# Patient Record
Sex: Female | Born: 1975 | Race: Black or African American | Hispanic: No | Marital: Single | Smoking: Current every day smoker
Health system: Southern US, Community
[De-identification: ages and names within clinical notes are randomized; demographics above are authoritative.]

## PROBLEM LIST (undated history)

## (undated) DIAGNOSIS — E669 Obesity, unspecified: Secondary | ICD-10-CM

## (undated) HISTORY — PX: OTHER SURGICAL HISTORY: SHX169

## (undated) HISTORY — PX: TONSILLECTOMY: SUR1361

## (undated) HISTORY — PX: TUBAL LIGATION: SHX77

---

## 1997-07-10 ENCOUNTER — Inpatient Hospital Stay (HOSPITAL_COMMUNITY): Admission: AD | Admit: 1997-07-10 | Discharge: 1997-07-10 | Payer: Self-pay | Admitting: *Deleted

## 1997-07-11 ENCOUNTER — Inpatient Hospital Stay (HOSPITAL_COMMUNITY): Admission: AD | Admit: 1997-07-11 | Discharge: 1997-07-11 | Payer: Self-pay | Admitting: *Deleted

## 1997-07-12 ENCOUNTER — Inpatient Hospital Stay (HOSPITAL_COMMUNITY): Admission: AD | Admit: 1997-07-12 | Discharge: 1997-07-15 | Payer: Self-pay | Admitting: *Deleted

## 1998-02-09 ENCOUNTER — Emergency Department (HOSPITAL_COMMUNITY): Admission: EM | Admit: 1998-02-09 | Discharge: 1998-02-09 | Payer: Self-pay | Admitting: Emergency Medicine

## 1998-04-17 ENCOUNTER — Ambulatory Visit (HOSPITAL_COMMUNITY): Admission: RE | Admit: 1998-04-17 | Discharge: 1998-04-17 | Payer: Self-pay | Admitting: *Deleted

## 1998-05-19 ENCOUNTER — Inpatient Hospital Stay (HOSPITAL_COMMUNITY): Admission: AD | Admit: 1998-05-19 | Discharge: 1998-05-19 | Payer: Self-pay | Admitting: *Deleted

## 1998-05-19 ENCOUNTER — Encounter: Payer: Self-pay | Admitting: *Deleted

## 1998-08-15 ENCOUNTER — Inpatient Hospital Stay (HOSPITAL_COMMUNITY): Admission: AD | Admit: 1998-08-15 | Discharge: 1998-08-15 | Payer: Self-pay | Admitting: *Deleted

## 1998-09-14 ENCOUNTER — Ambulatory Visit (HOSPITAL_COMMUNITY): Admission: RE | Admit: 1998-09-14 | Discharge: 1998-09-14 | Payer: Self-pay | Admitting: *Deleted

## 1998-09-14 ENCOUNTER — Inpatient Hospital Stay (HOSPITAL_COMMUNITY): Admission: AD | Admit: 1998-09-14 | Discharge: 1998-09-17 | Payer: Self-pay | Admitting: *Deleted

## 1999-08-04 ENCOUNTER — Ambulatory Visit (HOSPITAL_COMMUNITY): Admission: RE | Admit: 1999-08-04 | Discharge: 1999-08-04 | Payer: Self-pay | Admitting: *Deleted

## 1999-08-04 ENCOUNTER — Encounter: Payer: Self-pay | Admitting: *Deleted

## 1999-10-11 ENCOUNTER — Inpatient Hospital Stay (HOSPITAL_COMMUNITY): Admission: AD | Admit: 1999-10-11 | Discharge: 1999-10-13 | Payer: Self-pay | Admitting: *Deleted

## 1999-11-25 ENCOUNTER — Ambulatory Visit (HOSPITAL_COMMUNITY): Admission: RE | Admit: 1999-11-25 | Discharge: 1999-11-25 | Payer: Self-pay | Admitting: *Deleted

## 2001-06-16 ENCOUNTER — Emergency Department (HOSPITAL_COMMUNITY): Admission: EM | Admit: 2001-06-16 | Discharge: 2001-06-16 | Payer: Self-pay | Admitting: Emergency Medicine

## 2002-08-15 ENCOUNTER — Emergency Department (HOSPITAL_COMMUNITY): Admission: EM | Admit: 2002-08-15 | Discharge: 2002-08-15 | Payer: Self-pay | Admitting: Emergency Medicine

## 2002-10-05 ENCOUNTER — Encounter: Payer: Self-pay | Admitting: Emergency Medicine

## 2002-10-05 ENCOUNTER — Emergency Department (HOSPITAL_COMMUNITY): Admission: EM | Admit: 2002-10-05 | Discharge: 2002-10-05 | Payer: Self-pay | Admitting: Emergency Medicine

## 2002-12-28 ENCOUNTER — Inpatient Hospital Stay (HOSPITAL_COMMUNITY): Admission: AD | Admit: 2002-12-28 | Discharge: 2002-12-28 | Payer: Self-pay | Admitting: Obstetrics & Gynecology

## 2003-09-14 ENCOUNTER — Inpatient Hospital Stay (HOSPITAL_COMMUNITY): Admission: AD | Admit: 2003-09-14 | Discharge: 2003-09-14 | Payer: Self-pay | Admitting: Obstetrics & Gynecology

## 2003-09-18 ENCOUNTER — Ambulatory Visit (HOSPITAL_COMMUNITY): Admission: RE | Admit: 2003-09-18 | Discharge: 2003-09-18 | Payer: Self-pay | Admitting: General Surgery

## 2003-09-24 ENCOUNTER — Observation Stay (HOSPITAL_COMMUNITY): Admission: RE | Admit: 2003-09-24 | Discharge: 2003-09-25 | Payer: Self-pay | Admitting: General Surgery

## 2003-09-24 ENCOUNTER — Encounter (INDEPENDENT_AMBULATORY_CARE_PROVIDER_SITE_OTHER): Payer: Self-pay | Admitting: Specialist

## 2003-11-10 ENCOUNTER — Emergency Department (HOSPITAL_COMMUNITY): Admission: EM | Admit: 2003-11-10 | Discharge: 2003-11-10 | Payer: Self-pay | Admitting: *Deleted

## 2004-02-06 ENCOUNTER — Emergency Department (HOSPITAL_COMMUNITY): Admission: EM | Admit: 2004-02-06 | Discharge: 2004-02-06 | Payer: Self-pay | Admitting: Emergency Medicine

## 2004-08-16 ENCOUNTER — Inpatient Hospital Stay (HOSPITAL_COMMUNITY): Admission: AD | Admit: 2004-08-16 | Discharge: 2004-08-16 | Payer: Self-pay | Admitting: Obstetrics

## 2004-11-11 ENCOUNTER — Emergency Department (HOSPITAL_COMMUNITY): Admission: EM | Admit: 2004-11-11 | Discharge: 2004-11-11 | Payer: Self-pay | Admitting: Family Medicine

## 2004-12-27 IMAGING — RF DG CHOLANGIOGRAM OPERATIVE
1 series · 16 of 16 positions shown · non-contrast
Comparison: none

CLINICAL DATA: cholelithiasis; cholecystitis
 OPERATIVE CHOLANGIOGRAM
 The common bile duct is normal in caliber.  There are several small questionable filling defects seen within the mid-common duct noted on the cholangiogram.  There is spillage of contrast into the duodenum.  The intrahepatic biliary radicals have a normal appearance and there are no strictures.  
 IMPRESSION
 Question several small retained common duct stones within the mid-common bile duct (versus injected air bubbles).  Free spillage of contrast into the duodenum noted.

[Series 1: run · 16 of 16 slices shown]
[im 1/16]
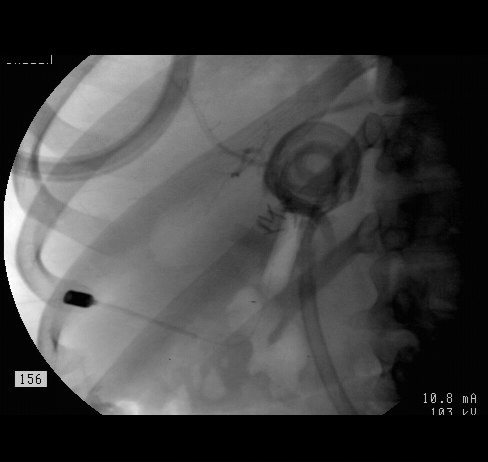
[im 2/16]
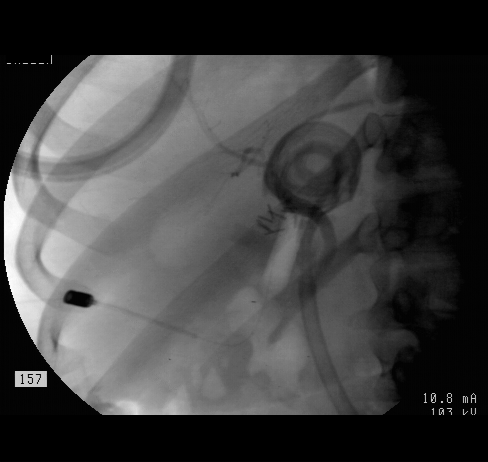
[im 3/16]
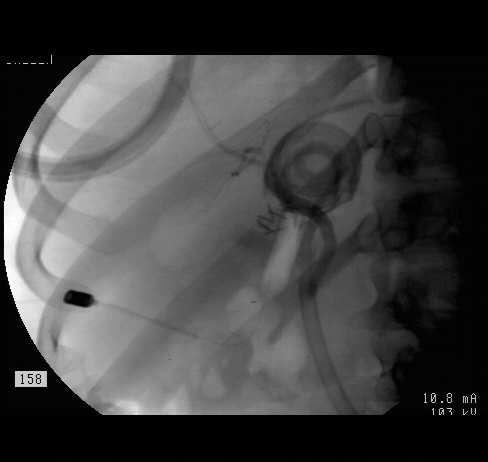
[im 4/16]
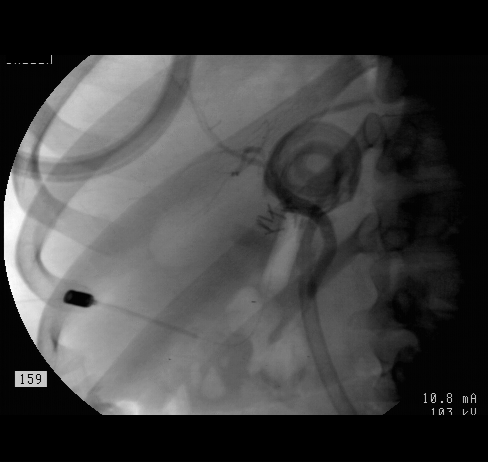
[im 5/16]
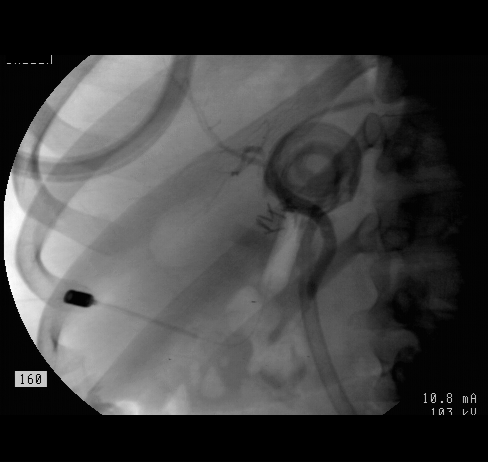
[im 6/16]
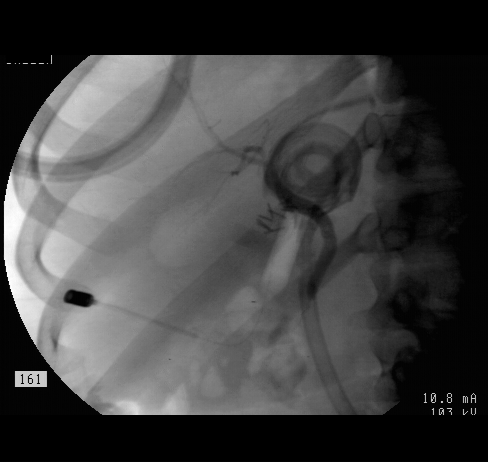
[im 7/16]
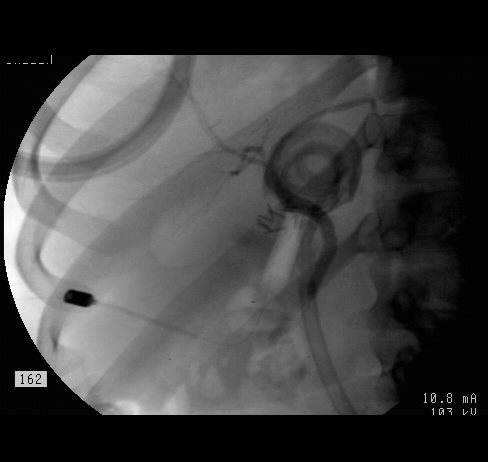
[im 8/16]
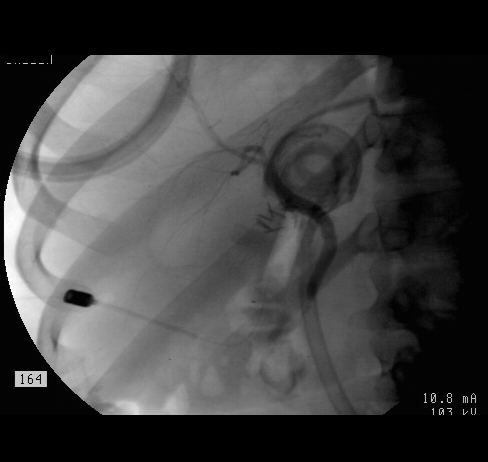
[im 9/16]
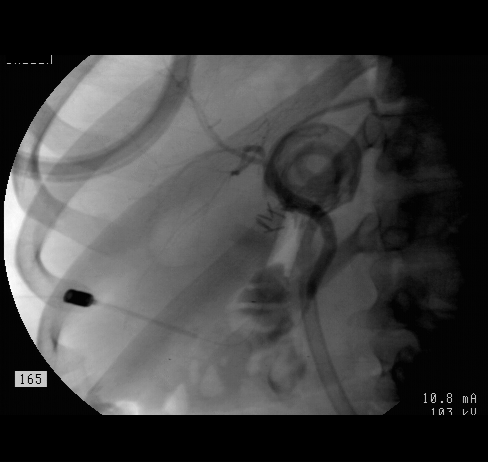
[im 10/16]
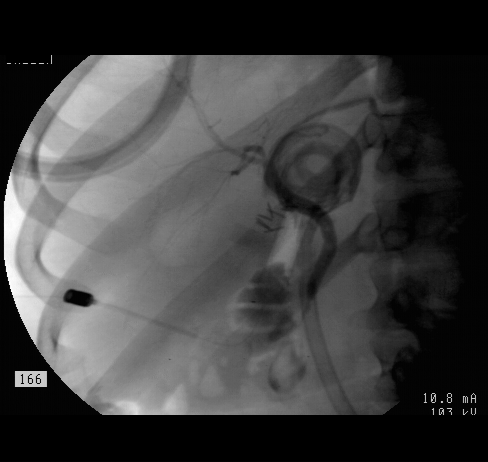
[im 11/16]
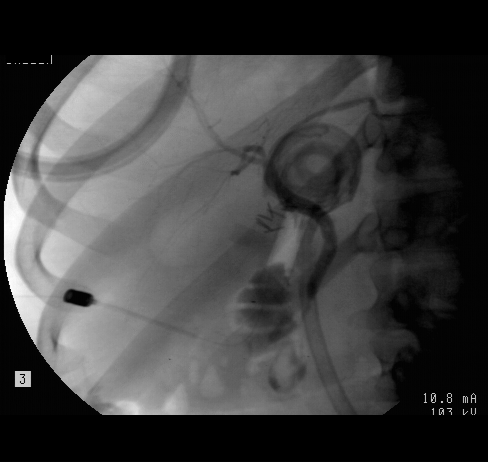
[im 12/16]
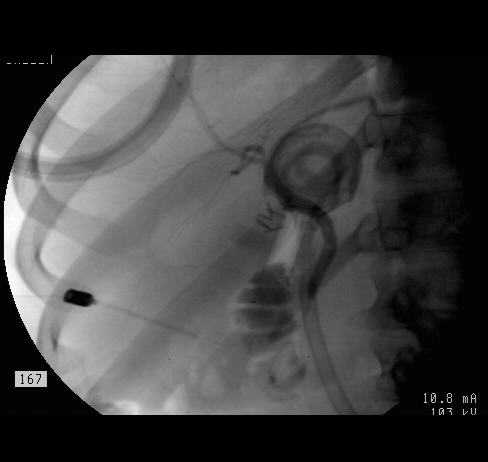
[im 13/16]
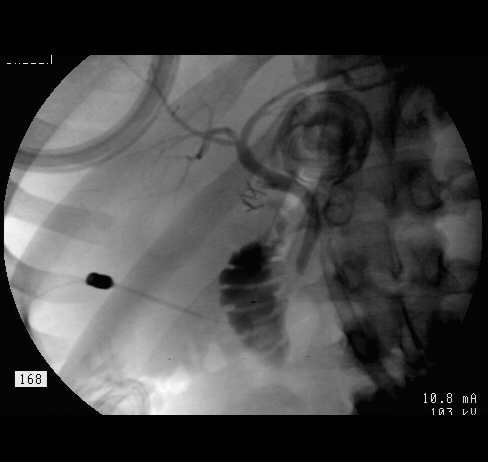
[im 14/16]
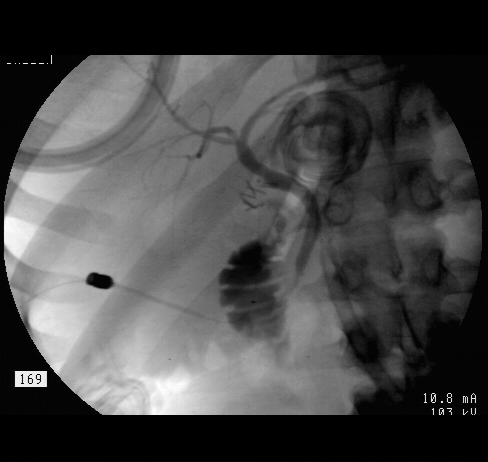
[im 15/16]
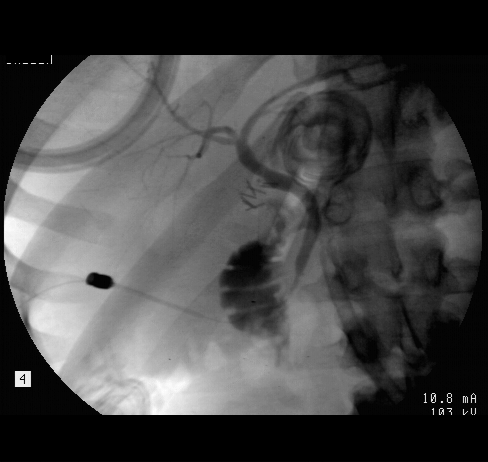
[im 16/16]
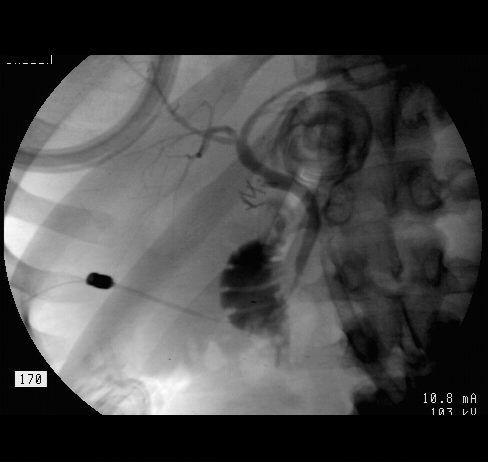

[16 of 16 positions shown; findings below may reference images not displayed]

## 2005-10-08 ENCOUNTER — Emergency Department (HOSPITAL_COMMUNITY): Admission: EM | Admit: 2005-10-08 | Discharge: 2005-10-08 | Payer: Self-pay | Admitting: Family Medicine

## 2007-02-13 ENCOUNTER — Emergency Department (HOSPITAL_COMMUNITY): Admission: EM | Admit: 2007-02-13 | Discharge: 2007-02-13 | Payer: Self-pay | Admitting: Emergency Medicine

## 2007-03-21 ENCOUNTER — Encounter (INDEPENDENT_AMBULATORY_CARE_PROVIDER_SITE_OTHER): Payer: Self-pay | Admitting: Family Medicine

## 2007-03-21 ENCOUNTER — Other Ambulatory Visit: Admission: RE | Admit: 2007-03-21 | Discharge: 2007-03-21 | Payer: Self-pay | Admitting: Family Medicine

## 2007-03-21 ENCOUNTER — Ambulatory Visit: Payer: Self-pay | Admitting: Sports Medicine

## 2007-03-21 DIAGNOSIS — H9190 Unspecified hearing loss, unspecified ear: Secondary | ICD-10-CM | POA: Insufficient documentation

## 2007-03-21 DIAGNOSIS — E669 Obesity, unspecified: Secondary | ICD-10-CM

## 2007-03-21 LAB — CONVERTED CEMR LAB
Chlamydia, DNA Probe: NEGATIVE
GC Probe Amp, Genital: NEGATIVE

## 2007-03-26 ENCOUNTER — Encounter (INDEPENDENT_AMBULATORY_CARE_PROVIDER_SITE_OTHER): Payer: Self-pay | Admitting: Family Medicine

## 2007-03-28 ENCOUNTER — Encounter (INDEPENDENT_AMBULATORY_CARE_PROVIDER_SITE_OTHER): Payer: Self-pay | Admitting: *Deleted

## 2009-03-09 ENCOUNTER — Encounter (INDEPENDENT_AMBULATORY_CARE_PROVIDER_SITE_OTHER): Payer: Self-pay | Admitting: *Deleted

## 2009-03-09 DIAGNOSIS — F17200 Nicotine dependence, unspecified, uncomplicated: Secondary | ICD-10-CM | POA: Insufficient documentation

## 2009-07-24 ENCOUNTER — Encounter: Payer: Self-pay | Admitting: Sports Medicine

## 2009-07-24 ENCOUNTER — Ambulatory Visit: Payer: Self-pay | Admitting: Family Medicine

## 2009-08-06 ENCOUNTER — Encounter: Payer: Self-pay | Admitting: Sports Medicine

## 2010-07-01 NOTE — Letter (Signed)
Summary: Handout Printed  Printed Handout:  - Ear - Otosclerosis

## 2010-07-01 NOTE — Letter (Signed)
Summary: *Referral Letter ENT for hearing loss  [facility]  [address]   [place], [place] [address]   Phone: [phone]  Fax: [phone]    07/24/2009  Thank you in advance for agreeing to see my patient:  Kelly Holloway [address] [place], [place]  [address]  Phone: [phone]  Reason for Referral: Pt with hearing loss since childhood, multiple myringostomy tube placements.  Worsening hearing to all pitches, but particularly to low pitched sounds on audiometry.  Exam c/w bilateral eardrum scarring.  Weber's does not lateralize, Rinne normal in right ear, abnormal conduction in left ear.   Audiometry: Left  500 hz: No Response 1000 hz: No Response 2000 hz: 20db 4000 hz: 20db Right  500 hz: No Response 1000 hz: No Response 2000 hz: 20db 4000 hz: 20db  Please assist with further testing/intervention for her scarred eardrums/audiology referral if necessary.  Has never been exposed to ototoxic drugs.  Past Medical History: 1)  - h/o worsening hearing loss 2)  - obesity 3)  - [id number]   Thank you again for agreeing to see our patient; please contact us if you have any further questions or need additional information.  Sincerely,  Rodney Langton MD

## 2010-07-01 NOTE — Assessment & Plan Note (Signed)
Summary: hearing loss,tcb   Vital Signs:  Patient profile:   34 year old female Weight:      322.06 pounds BMI:     46.38 Temp:     98.1 degrees F oral Pulse rate:   91 / minute BP sitting:   117 / 79  (left arm)  Vitals Entered By: Terese Door (July 24, 2009 1:50 PM) CC: Hearing loss/check spots on legs Is Patient Diabetic? No Pain Assessment Patient in pain? no        Primary Care Provider:  Rodney Langton MD  CC:  Hearing loss/check spots on legs.  History of Present Illness: Hearing loss, bilateral, present since childhood, multiple myringostomies.  Progressive, worse with all pitches.  No ear pain, HA, vertigo, tinnitus.  Lip reads.  Speaks with lisp.  Eval'ed for this in 2008 and referred to ENT but never went.  Habits & Providers  Alcohol-Tobacco-Diet     Tobacco Status: quit > 6 months  Current Medications (verified): 1)  None  Allergies (verified): No Known Drug Allergies  Past History:  Past Medical History: Last updated: 03/21/2007 - h/o worsening hearing loss - obesity - [id number]  Past Surgical History: Last updated: 03/21/2007 - TNA as a child - B TM tubes as a child - GB removed 2004 - BTL 2001  Social History: Smoking Status:  quit > 6 months  Review of Systems       See HPI  Physical Exam  General:  Well-developed,well-nourished,in no acute distress; alert,appropriate and cooperative throughout examination Head:  Normocephalic and atraumatic without obvious abnormalities. No apparent alopecia or balding. Eyes:  No corneal or conjunctival inflammation noted. EOMI. Perrla.  Ears:  External ear exam shows no significant lesions or deformities.  Otoscopic examination reveals clear canals, tympanic membranes are scarred bilaterally.  Weber does not lateralize, rinne abnl conduction in L ear, borderline nl in right. Nose:  External nasal examination shows no deformity or inflammation. Nasal mucosa are pink and moist without  lesions or exudates. Mouth:  Oral mucosa and oropharynx without lesions or exudates.  Teeth in good repair. Neck:  No deformities, masses, or tenderness noted.   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  To ENT.  Orders: ENT Referral (ENT) [facility]  3 ([address]56)  Patient Instructions: 1)  ENT referral 2)  -Dr. Karie Schwalbe.

## 2010-07-01 NOTE — Consult Note (Signed)
Summary: [facility]  [facility]   Imported By: Abundio Miu 08/08/2009 13:25:19  _____________________________________________________________________  External Attachment:    Type:   Image     Comment:   External Document

## 2010-10-17 NOTE — Discharge Summary (Signed)
NAME:  MARDELL, SUTTLES                       ACCOUNT NO.:  [id number]   MEDICAL RECORD NO.:  [id number]                   PATIENT TYPE:  OBV   LOCATION:  0447                                 FACILITY:  [facility]   PHYSICIAN:  Leonie Man, M.D.                DATE OF BIRTH:  [birth year]   DATE OF ADMISSION:  09/24/2003  DATE OF DISCHARGE:                                 DISCHARGE SUMMARY   ADMISSION DIAGNOSIS:  Chronic calculus cholecystitis.   DISCHARGE DIAGNOSIS:  Chronic calculus cholecystis.   PROCEDURES IN HOSPITAL:  Laparoscopic cholecystectomy with intraoperative  cholangiogram.   COMPLICATIONS:  None.   CONDITION ON DISCHARGE:  Improved.   HISTORY OF PRESENT ILLNESS:  The patient is a 35 year old woman admitted to  the [facility] complaining of severe epigastric and right upper  quadrant pain associated with nausea and vomiting.  Ultrasound done showed a  contracted gallbladder with multiple stones.  Repeat ultrasound also showed  the same.  Normal liver function tests.  No amylase or lipase elevations.   HOSPITAL COURSE:  She was admitted on April 25 for elective laparoscopic  cholecystectomy which she underwent without complications.  Her  postoperative course was benign.  She is being discharged on September 25, 2003  to be followed in my office in two weeks.   DISCHARGE MEDICATIONS:  Vicodin 1-2 every four hours p.r.n. for pain.   ACTIVITY:  As tolerated.   DIET:  Unrestricted.                                               Leonie Man, M.D.    PB/MEDQ  D:  09/24/2003  T:  09/24/2003  Job:  [id number]   cc:   Kathreen Cosier, M.D.  [address]  [place]  [place] [address]  Fax: [phone]

## 2010-10-17 NOTE — H&P (Signed)
[facility]  Patient:    Kelly Holloway, Kelly Holloway                    MRN: [id number] Adm. Date:  [id number] Disc. Date: [id number] Attending:  Deniece Ree                         History and Physical  HISTORY:  The patient is a 35 year old multiparous female who desires permanent  sterilization.  All the different types of contraceptives that are available were explained to this patient, which she understands; however, does not wish any further pregnancies.  She understands this procedure is intended to be permanent; however, cannot be guaranteed.  All of her questions were answered to her satisfaction.  PAST MEDICAL HISTORY:  Significant in that the patient has had three vaginal deliveries.  She has no known medical problems.  PHYSICAL EXAMINATION:  GENERAL:  Reveals a well-developed, well-nourished obese female in no acute distress.  HEAD, EARS, EYES, NOSE AND THROAT:  Within normal limits.  NECK:  Supple.  BREASTS:  Without masses, tenderness or discharge.  LUNGS:  Clear to percussion/auscultation.  HEART:  Normal sinus rhythm without murmurs, rubs or gallops.  ABDOMEN:  Massively obese; however, benign.  EXTREMITIES:  Within normal limits.  NEUROLOGIC:  Within normal limits.  PELVIC:  Examination revealed external genitalia.  BUS within normal limits. The vagina is clear.  The cervix is firm and nontender.  The uterus is of normal size/ shape/consistency.  Adnexa benign.  DIAGNOSIS:  Multiparity; desiring permanent sterilization.  PLAN:  Laparoscopic bilateral tubular cauterization with tubal resection. DD:  11/25/99 TD:  11/25/99 Job: [id number]

## 2010-10-17 NOTE — Consult Note (Signed)
NAME:  Kelly Holloway, SCHMUCK NO.:  [id number]   MEDICAL RECORD NO.:  [id number]                   PATIENT TYPE:  MAT   LOCATION:  MATC                                 FACILITY:  [facility]   PHYSICIAN:  Leonie Man, M.D.                DATE OF BIRTH:  12/12/1975   DATE OF CONSULTATION:  09/14/2003  DATE OF DISCHARGE:                                   CONSULTATION   REQUESTING PHYSICIAN:  Dr. Francoise Ceo.   PROBLEM:  Upper abdominal pain, nausea, and vomiting starting at 7 p.m. on  September 13, 2003 after eating a peanut butter sandwich in this 35 year old  female.  She has no similar history of pain in the past.  She has not had  any fevers, chills, or jaundice.   PAST MEDICAL HISTORY:  She has not had any previous surgery.   MEDICATIONS:  None.   ALLERGIES:  None.   SOCIAL HISTORY:  Single black female.  She smokes approximately one pack of  cigarettes a day.  She uses alcohol on weekends.  She does not use  recreational drugs.   REVIEW OF SYSTEMS:  Negative in detail except as above.   PHYSICAL EXAMINATION:  VITAL SIGNS:  The patient is afebrile, having entered  the hospital with a temperature of 97.6 rising to 98.1.  Her blood pressure  is 128/74 and pulse is 67 and regular.  HEENT:  No thyromegaly, no cervical adenopathy, benign oropharynx.  Pupils  are equal and reactive.  The sclerae is anicteric.  LUNGS:  Clear to auscultation bilaterally.  HEART:  Regular rate and rhythm.  ABDOMEN:  There is a skin rash above the navel which the patient says is  caused by the metallic buckle of her belts.  The abdomen is obese.  There  are normal active bowel sounds.  The abdomen is tender on deep palpation on  the right upper quadrant but without rebound tenderness.  EXTREMITIES:  Show no calf tenderness, no ankle edema, and the distal pulses  are normal.  NEUROLOGIC:  The patient is really quite drowsy after having been sedated.  She moves all four  extremities well and grossly there are no sensory  deficits.   REVIEWED LABORATORY FINDINGS:  White blood cell count 8.1, hemoglobin is  13.6, hematocrit is 40.4, platelet count of 366.  Urinalysis is negative.  Her liver function studies are normal.  Amylase and lipase is pending.   Abdominal ultrasound done today shows probable small gallstones and a  contracted gallbladder.  The study is suboptimal due to the patient having  eaten prior to the study and she will need to have this repeated after an 8-  hour fast.   ASSESSMENT:  Biliary colic with chronic calculous cholecystitis.  The plan  is for [place] to sedate her today.  We will send her home on a low fat diet.  I  will start her on  ciprofloxacin 500 mg b.i.d. and Mepergan 50/25 for pain.  The patient is to call for recurrent symptoms.  We will then schedule her  for surgery through our office following repeat ultrasound on Monday.                                               Leonie Man, M.D.    PB/MEDQ  D:  09/14/2003  T:  09/14/2003  Job:  [id number]

## 2010-10-17 NOTE — H&P (Signed)
NAME:  Kelly Holloway, Kelly Holloway                       ACCOUNT NO.:  [id number]   MEDICAL RECORD NO.:  [id number]                   PATIENT TYPE:  AMB   LOCATION:  DAY                                  FACILITY:  [facility]   PHYSICIAN:  Leonie Man, M.D.                DATE OF BIRTH:  September 02, 1975   DATE OF ADMISSION:  09/24/2003  DATE OF DISCHARGE:                                HISTORY & PHYSICAL   PROBLEM:  Calculus cholecystitis.   HISTORY:  This patient is a 35 year old woman presenting with upper  abdominal pain associated with nausea and vomiting starting at approximately  7:00 on September 13, 2003, after she ate a peanut butter and jelly sandwich.  She had no history of previous pain in the past.  She had not had any  history of fever, chills, or jaundice.  She was admitted to the [facility] on September 14, 2003, and evaluated at that time with an  ultrasound which showed a shrunken gallbladder with probable stones.  She  had not been fasting.  She had a repeat ultrasound on September 18, 2003, after  eight hours of fasting that again showed a contracted gallbladder filled  with multiple stones.  The patient also had a nonspecific Murphy's sign at  that time.  Her liver function studies done on September 14, 2003, showed no  evidence of liver function abnormalities.  She had no leukocytosis or fever  at that time.   PAST MEDICAL HISTORY:  She has had no previous surgery.   MEDICATIONS:  She does not take any medications regularly.   ALLERGIES:  No known drug allergies.   SOCIAL HISTORY:  This is a single black female.  She smokes approximately  one pack of cigarettes a day.  She does use alcohol occasionally, but does  not use any recreational street drugs.   REVIEW OF SYSTEMS:  Negative in detail except as outlined above.   PHYSICAL EXAMINATION:  VITAL SIGNS:  This is an afebrile patient whose vital  signs are stable.  HEENT:  Head is normocephalic.  Pupils are equal and  reactive.  Oropharynx  is benign.  NECK:  No thyromegaly, no adenopathy.  LUNGS:  Clear to auscultation.  HEART:  Regular rate and rhythm, no murmurs or gallop rhythms heard.  ABDOMEN:  She has a skin rash over the periumbilical region which she thinks  is secondary to a metal buckle that she wears on her belt.  The abdomen is  obese.  Bowel sounds are normoactive.  She has some tenderness in the right  upper quadrant on deep palpation.  There is no palpable hepatosplenomegaly.  EXTREMITIES:  There is no calf tenderness, no ankle edema.  Pulses distally  are within normal limits.  NEUROLOGIC:  Grossly within normal limits.   LABORATORY DATA:  Reviewed laboratory results are as follows:  On CBC, the  patient has a white count  of 8.1, hemoglobin of 13.6, and hematocrit of  40.4.  On her CMET she has a sodium of 141, potassium of 4.1, chloride of  105, CO2 is 30, glucose is 107, BUN is 7, and creatinine is 1.  Total  bilirubin is 1.2.  Alkaline phosphatase is 40, SGOT is 19, and SGPT is 18.  Her amylase is 67, and her lipase is 26.  She had an urine pregnancy test  which was negative.   ASSESSMENT:  Chronic calculus cholecystitis in this 35 year old female  presenting with right upper quadrant abdominal pain consistent with biliary  colic.   PLAN:  Admission to the [facility] on September 24, 2003.  She will  undergo at that point a laparoscopic cholecystectomy with intraoperative  cholangiogram.  The risks and potential benefits of surgery have been  discussed with the patient.  All questions answered and consent obtained for  laparoscopic cholecystectomy.                                               Leonie Man, M.D.    PB/MEDQ  D:  09/21/2003  T:  09/21/2003  Job:  [id number]   cc:   Kathreen Cosier, M.D.  [address]  [place]  [place] [address]  Fax: [phone]

## 2010-10-17 NOTE — Op Note (Signed)
[facility]  Patient:    Kelly Holloway, Kelly Holloway                    MRN: [id number] Proc. Date: 11/25/99 Adm. Date:  [id number] Attending:  Deniece Ree                           Operative Report  PREOPERATIVE DIAGNOSIS:       Multiparity with desire for permanent sterilization.  POSTOPERATIVE DIAGNOSIS:      Multiparity with desire for permanent sterilization.  OPERATION:                    Initial laparoscopic followed by open incision for insertion of trocar and carbon dioxide for a tubal ligation.  SURGEON:                      Deniece Ree, M.D.  ANESTHESIA:                   General.  ESTIMATED BLOOD LOSS:         About 25 cc.  COMPLICATIONS:                None.  DISPOSITION:                  The patient tolerated the procedure well and returned to the recovery room in satisfactory condition.  DESCRIPTION OF PROCEDURE:     The patient was taken to the operating room, prepped and draped in the usual fashion for laparoscopic surgery.  A speculum was placed in the vagina, following which the anterior lip of the cervix was then grasped with a Christella Hartigan tenaculum.  The acorn uterine manipulating cannula was then put into place.  A subumbilical incision was then made, following which the Veress needle was introduced and the beginning of carbon dioxide was instituted.  There was a moderate amount of difficulty getting the pressure right.  Therefore, the Veress needle method of introducing the carbon dioxide was then abandoned.  The incision was widened, following which (after making a small incision in the fascia) a trocar was then utilized for introduction into the abdominal cavity.  At this point, the carbon dioxide was infused by way of this instrument.  The laparoscopic trocar was placed through the sleeve, following which the right tube was identified and followed out to the fimbriated end.  It was then grasped 5 mm proximal to the right  cornu and cauterized approximately 3-4 cm in length.  This was done likewise on the left with some difficulty after which cauterized segment of tube, was cut in one location.  Hemostasis remained present.  The tubes and ovaries appeared to be within normal limits.  The carbon dioxide was then allowed to escape from the abdominal cavity without any problem.  The incision was then closed, first with a deep interrupted suture, followed by subcuticular stitches of 4-0 Vicryl.  The procedure was terminated.  The patient tolerated the procedure well and returned to the recovery room in satisfactory condition.  The patient is to be discharged when fully alert.  She has been instructed on the possible complications and care for this type of surgery.  She has been told to return to my office in four weeks for follow up evaluation or to call me prior to that time should any problems arise. DD:  11/25/99 TD:  11/26/99 Job: [id number]  [id number]

## 2010-10-17 NOTE — Op Note (Signed)
NAME:  Kelly Holloway, Kelly Holloway                       ACCOUNT NO.:  [id number]   MEDICAL RECORD NO.:  [id number]                   PATIENT TYPE:  AMB   LOCATION:  DAY                                  FACILITY:  [facility]   PHYSICIAN:  Leonie Man, M.D.                DATE OF BIRTH:  1975-08-15   DATE OF PROCEDURE:  09/24/2003  DATE OF DISCHARGE:                                 OPERATIVE REPORT   PREOPERATIVE DIAGNOSIS:  Chronic calculus cholecystitis.   POSTOPERATIVE DIAGNOSIS:  Chronic calculus cholecystitis.   PROCEDURE:  Laparoscopic cholecystectomy with intraoperative cholangiogram.   SURGEON:  Leonie Man, M.D.   ASSISTANT:  Rose Phi. Maple Hudson, M.D.   ANESTHESIA:  General.   INDICATIONS FOR PROCEDURE:  The patient is a 35 year old woman seen in the  emergency area of the [facility] on September 14, 2003 because of severe  upper abdominal pain, nausea and vomiting.  This has occurred after eating a  peanut butter sandwich. She had an ultrasound which showed a contracted  gallbladder with stones.  She had eaten, and repeat ultrasound also showed  the gallbladder content to be contracted.  Liver function studies have been  normal; no hyperamylasemia or elevated lipase.  The patient comes to the  operating room now after the risks and potential benefits of laparoscopic  cholecystectomy have bee fully discussed with her.  I have answered all her  questions and she gives consent to surgery.   DESCRIPTION OF PROCEDURE:  Following the induction of satisfactory general  anesthesia, the patient was positioned supine and the abdomen was prepped  and draped routinely.  An open laparoscopy was created at the umbilicus with  an infraumbilical incision placed transversely.  The Hasson cannula was  inserted into the peritoneal cavity, and the peritoneum insufflated to 14  mmHg pressure using carbon dioxide.  A camera was inserted and visual  exploration of the abdomen carried out. The  gallbladder was noted to be  chronically scarred and shrunken.  The liver edges were sharp and the  surface smooth.  Anterior gastric wall and duodenal sweep appeared to be  normal.  None of the small or large intestines appeared to be abnormal.  The  pelvic organs were not visualized.   Under direct vision epigastric and lateral ports were placed.  The  gallbladder was grasped and retracted cephalad, as the dissection was  carried down the region of the ampulla of the gallbladder.  There the cystic  artery and cystic duct were dissected, tracing the cystic artery up to its  entry into the gallbladder wall, and tracing the cystic duct up to its  gallbladder cystic duct junction.  The common bile duct could be clearly  seen.  The cystic artery was doubly clipped and transected.  The cystic duct  was clipped proximally and opened.  I used a Riddick catheter passing  through the abdomen through a 14-gauge cholangiocatheter to a  cholangiogram,  using half-strength Hypaque dye.  The cholangiocatheter was inserted into  the cystic duct; the resulting cholangiogram under fluoroscopic guidance  showed prompt flow of contrast into the duodenum, without any evidence of  filling defects within any of the extrahepatic biliary ducts.  The  cholangiocatheter was removed.  The cystic duct was doubly clipped and then  transected. The gallbladder was then dissected free from the liver bed using  electrocautery, and maintaining hemostasis throughout the entire course of  the dissection.  At the end of this dissection the liver bed was again  inspected thoroughly.  Additional bleeding points were treated with  electrocautery.  The right upper quadrant was then thoroughly irrigated with  normal saline.  Camera was removed through the epigastric port and the  gallbladder retrieved through the umbilical port without difficulty.   Pneumoperitoneum was allowed to deflate, after the lateral trocars were  removed  under direct vision.  Sponge, instrument and sharp counts were  verified.  Valsalva maneuver was used to enter the peritoneum as much as  possible to hold the carbon dioxide.  The wounds were then closed in layers  as follows:  The umbilical wound in two layers using 0 Vicryl and 4-0  Monocryl.  Epigastric and lateral ports were closed with 4-0 Monocryl  sutures.  All wounds were reinforced with Steri-Strips.  Final sponge count  noted to be correct.   The patient was then removed from the operating room to the recovery room in  stable condition.  She tolerated the procedure well.                                               Leonie Man, M.D.    PB/MEDQ  D:  09/24/2003  T:  09/24/2003  Job:  [id number]

## 2011-01-13 ENCOUNTER — Inpatient Hospital Stay (INDEPENDENT_AMBULATORY_CARE_PROVIDER_SITE_OTHER)
Admission: RE | Admit: 2011-01-13 | Discharge: 2011-01-13 | Disposition: A | Discharge status: Home / Self Care | Payer: Medicaid Other | Source: Ambulatory Visit | Attending: Emergency Medicine | Admitting: Emergency Medicine

## 2011-01-13 DIAGNOSIS — L989 Disorder of the skin and subcutaneous tissue, unspecified: Secondary | ICD-10-CM

## 2011-06-24 ENCOUNTER — Encounter: Payer: Medicaid Other | Admitting: Family Medicine

## 2012-01-05 ENCOUNTER — Emergency Department (INDEPENDENT_AMBULATORY_CARE_PROVIDER_SITE_OTHER)
Admission: EM | Admit: 2012-01-05 | Discharge: 2012-01-05 | Disposition: A | Discharge status: Home / Self Care | Payer: Self-pay | Source: Home / Self Care | Attending: Emergency Medicine | Admitting: Emergency Medicine

## 2012-01-05 ENCOUNTER — Encounter (HOSPITAL_COMMUNITY): Payer: Self-pay | Admitting: Emergency Medicine

## 2012-01-05 DIAGNOSIS — R079 Chest pain, unspecified: Secondary | ICD-10-CM

## 2012-01-05 HISTORY — DX: Obesity, unspecified: E66.9

## 2012-01-05 MED ORDER — NAPROXEN 500 MG PO TABS: 500.0000 mg | ORAL_TABLET | Freq: Two times a day (BID) | ORAL | Status: DC

## 2012-01-05 NOTE — ED Provider Notes (Signed)
History     CSN: [id number]  Arrival date & time 01/05/12  Kelly Holloway   First MD Initiated Contact with Patient 01/05/12 1837      Chief Complaint  Patient presents with  . Chest Pain    (Consider location/radiation/quality/duration/timing/severity/associated sxs/prior treatment) HPI Comments: C/o CP describes as sharp substernal seconds long nonradiating starting today. No positional, exertional component. Not related to eating. Denies belching, waterbrash, abd pain. No cough, wheeze, SOB, N/V, diaphoresis. No fevers, recent URI. No change in physical activity. 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 No smoking, ETOH, illicit drugs. FH neg for early MI  Patient is a 36 y.o. female presenting with chest pain. The history is provided by the patient. No language interpreter was used.  Chest Pain The chest pain began 6 - 12 hours ago. Chest pain occurs intermittently. The chest pain is unchanged. The quality of the pain is described as sharp. The pain does not radiate. Pertinent negatives for primary symptoms include no fever, no fatigue, no syncope, no shortness of breath, no cough, no wheezing, no palpitations, no abdominal pain, no nausea, no vomiting and no dizziness.  Pertinent negatives for associated symptoms include no lower extremity edema, no near-syncope and no weakness. She tried nothing for the symptoms. Risk factors include obesity.  Pertinent negatives for past medical history include no CAD, no diabetes, no DVT, no MI, no PE and no PVD.  Pertinent negatives for family medical history include: no early MI in family.     Past Medical History  Diagnosis Date  . Obesity     Past Surgical History  Procedure Date  . Tubal ligation   . Tonsillectomy   . Gallstones     History reviewed. No pertinent family history.  History  Substance Use Topics  . Smoking status: Never Smoker   . Smokeless tobacco: Not on file  . Alcohol Use: No    OB History    Grav Para Term Preterm  Abortions TAB SAB Ect Mult Living                  Review of Systems  Constitutional: Negative for fever and fatigue.  Respiratory: Negative for cough, shortness of breath and wheezing.   Cardiovascular: Positive for chest pain. Negative for palpitations, syncope and near-syncope.  Gastrointestinal: Negative for nausea, vomiting and abdominal pain.  Neurological: Negative for dizziness and weakness.    Allergies  Review of patient's allergies indicates no known allergies.  Home Medications   Current Outpatient Rx  Name Route Sig Dispense Refill  . NAPROXEN 500 MG PO TABS Oral Take 1 tablet (500 mg total) by mouth 2 (two) times daily. 20 tablet 0    BP 121/83  Pulse 70  Temp 98.2 F (36.8 C) (Oral)  Resp 18  SpO2 100%  LMP 12/19/2011  Physical Exam  Nursing note and vitals reviewed. Constitutional: She is oriented to person, place, and time. She appears well-developed and well-nourished.       Morbidly obese  HENT:  Head: Normocephalic and atraumatic.  Eyes: Conjunctivae and EOM are normal.  Neck: Normal range of motion.  Cardiovascular: Normal rate, regular rhythm, normal heart sounds and intact distal pulses.   No murmur heard. Pulmonary/Chest: Effort normal and breath sounds normal. She exhibits no tenderness.    Abdominal: Soft. Bowel sounds are normal. She exhibits no distension. There is no tenderness. There is no rebound and no guarding.  Musculoskeletal: Normal range of motion. She exhibits no  edema and no tenderness.  Neurological: She is alert and oriented to person, place, and time.  Skin: Skin is warm and dry.  Psychiatric: She has a normal mood and affect. Her behavior is normal. Judgment and thought content normal.    ED Course  Procedures (including critical care time)  Labs Reviewed - No data to display No results found.   1. Nonspecific chest pain    EKG: NSR rate 62, nml intervals, nml axis, no hypertrophy, no sttw changes compared to  previous EKG.    MDM  Pt appears to be in NAD, non-toxic, VSS. No evidence of STEMI or acute ischemic changes on EKG. Doubt ACS, PE, ruptured esophagus, aortic dissection, PNA or PTX based on H&P. Pt PERC and Wells neg. Only RF for ACS is obesity. Will try high dose nsaids, +/- H2/PPI. Discussed sx/sn that should prompt return to ED. Pt agrees with plan.   Luiz Blare, MD 01/07/12 [id number]

## 2012-01-05 NOTE — ED Notes (Signed)
Reports intermittent chest pain.  Reports pain is brief, but significant, and sharp.  Denies sob, diaphoresis, denies nausea, denies vomiting.  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  Denies cough, denies runny nose

## 2012-05-16 ENCOUNTER — Encounter (HOSPITAL_COMMUNITY): Payer: Self-pay | Admitting: Emergency Medicine

## 2012-05-16 ENCOUNTER — Emergency Department (HOSPITAL_COMMUNITY): Payer: Self-pay

## 2012-05-16 ENCOUNTER — Emergency Department (HOSPITAL_COMMUNITY)
Admission: EM | Admit: 2012-05-16 | Discharge: 2012-05-16 | Disposition: A | Discharge status: Home / Self Care | Payer: Self-pay | Attending: Emergency Medicine | Admitting: Emergency Medicine

## 2012-05-16 DIAGNOSIS — Z791 Long term (current) use of non-steroidal anti-inflammatories (NSAID): Secondary | ICD-10-CM | POA: Insufficient documentation

## 2012-05-16 DIAGNOSIS — E669 Obesity, unspecified: Secondary | ICD-10-CM | POA: Insufficient documentation

## 2012-05-16 DIAGNOSIS — M5430 Sciatica, unspecified side: Secondary | ICD-10-CM | POA: Insufficient documentation

## 2012-05-16 MED ORDER — DIAZEPAM 5 MG PO TABS: 5.0000 mg | ORAL_TABLET | Freq: Two times a day (BID) | ORAL | Status: DC

## 2012-05-16 MED ORDER — IBUPROFEN 800 MG PO TABS: 800.0000 mg | ORAL_TABLET | Freq: Three times a day (TID) | ORAL | Status: DC

## 2012-05-16 MED ORDER — OXYCODONE-ACETAMINOPHEN 5-325 MG PO TABS
1.0000 | ORAL_TABLET | Freq: Once | ORAL | Status: AC
  Administered 2012-05-16: 1 via ORAL
  Filled 2012-05-16: qty 1

## 2012-05-16 MED ORDER — KETOROLAC TROMETHAMINE 60 MG/2ML IM SOLN
60.0000 mg | Freq: Once | INTRAMUSCULAR | Status: AC
  Administered 2012-05-16: 60 mg via INTRAMUSCULAR
  Filled 2012-05-16: qty 2

## 2012-05-16 NOTE — ED Provider Notes (Signed)
History  This chart was scribed for [name], a non-physician practitioner, working with Lyanne Co, MD by Bennett Scrape, ED Scribe. This patient was seen in room WTR9/WTR9 and the patient's care was started at 1:24 PM.  CSN: [id number]  Arrival date & time 05/16/12  1259   First MD Initiated Contact with Patient 05/16/12 1324      Chief Complaint  Patient presents with  . Back Pain     The history is provided by the patient. No language interpreter was used.   Kelly Holloway is a 36 y.o. female who presents to the Emergency Department complaining of approximately 6 hours of sudden onset, constant left-sided lower back pain described as sharp and shooting that radiates down her left hip and left thigh that she woke up with but became suddenly worse while bending over at work. She reports that the pain is worse with walking on the left leg. She denies having prior episodes of similar symptoms. She denies taking OTC medications at home to improve symptoms. She denies any recent falls, numbness or tingling and urinary and bowel incontinence as associated symptoms. She does not have a h/o chronic medical conditions and denies smoking and alcohol use.  Past Medical History  Diagnosis Date  . Obesity     Past Surgical History  Procedure Date  . Tubal ligation   . Tonsillectomy   . Gallstones     History reviewed. No pertinent family history.  History  Substance Use Topics  . Smoking status: Never Smoker   . Smokeless tobacco: Not on file  . Alcohol Use: No    No OB history provided.  Review of Systems  Constitutional: Negative for fever and chills.  Cardiovascular: Negative for chest pain.  Gastrointestinal: Negative for abdominal pain.  Musculoskeletal: Positive for back pain (lower back pain that radiates into the left thigh). Negative for myalgias.  Neurological: Negative for weakness and numbness.  All other systems reviewed and are  negative.    Allergies  Review of patient's allergies indicates no known allergies.  Home Medications   Current Outpatient Rx  Name  Route  Sig  Dispense  Refill  . PSEUDOEPH-DOXYLAMINE-DM-APAP [id number] MG/30ML PO LIQD   Oral   Take 15 mLs by mouth daily as needed. Cough symptoms.         Marland Kitchen NAPROXEN 500 MG PO TABS   Oral   Take 500 mg by mouth 2 (two) times daily.           Triage Vitals: BP 144/81  Pulse 81  Temp 97.6 F (36.4 C) (Oral)  Resp 22  SpO2 97%  LMP 05/09/2012  Physical Exam  Nursing note and vitals reviewed. Constitutional: She is oriented to person, place, and time. She appears well-developed and well-nourished. No distress.  HENT:  Head: Normocephalic and atraumatic.  Eyes: EOM are normal.  Neck: Neck supple. No tracheal deviation present.  Cardiovascular: Normal rate.   Pulmonary/Chest: Effort normal. No respiratory distress.  Musculoskeletal:       Tenderness to palpation of the left lumbosacral spine, left paraspinal lumbosacral tenderness to palpation, lumbar spine ROM is limited due to pain  Neurological: She is alert and oriented to person, place, and time.       Decreased strength in the left leg secondary to pain, Sensation is equal and intact bilaterally  Skin: Skin is warm and dry.  Psychiatric: She has a normal mood and affect. Her behavior is normal.    ED Course  Procedures (including critical care time)  DIAGNOSTIC STUDIES: Oxygen Saturation is 97% on room air, adequate by my interpretation.    COORDINATION OF CARE: 1:30 PM- one tablet of 5-325 mg percocet ordered by attending provider.  1:53 PM-Pt states that the percocet has not improved her symptoms. Discussed treatment plan which includes x-ray of the back and shot of toradol with pt at bedside and pt agreed to plan.  2:00 AM- Ordered 60 mg Toradol injection.   Labs Reviewed - No data to display Dg Lumbar Spine Complete  05/16/2012  *RADIOLOGY REPORT*  Clinical  Data: Low back and left lower extremity pain.  LUMBAR SPINE - COMPLETE 4+ VIEW  Comparison: None.  Findings: Vascular clips in the right upper abdomen.  Bilateral pelvic phleboliths. There is no evidence of lumbar spine fracture. Alignment is normal.  Intervertebral disc spaces are maintained.  IMPRESSION: Negative.   Original Report Authenticated By: D. Andria Rhein, MD      1. Sciatica       MDM  3:49 PM Xray unremarkable for acute changes. Patient advised to follow up with her PCP for further evaluation and management. Patient will have ibuprofen and valium as needed for pain at home. No bowel or bladder incontinence or neuro deficits. Patient informed of results and agrees to plan.    I personally performed the services described in this documentation, which was scribed in my presence. The recorded information has been reviewed and is accurate.   [name], PA-C 05/16/12 1553

## 2012-05-16 NOTE — ED Notes (Signed)
Pt states she woke up this am with back pain, pt went to work and bent over and felt a shooting pain in her left hip and leg. Pt states she has continued to have pain in her lower back and left leg.

## 2012-05-17 NOTE — ED Provider Notes (Signed)
Medical screening examination/treatment/procedure(s) were performed by non-physician practitioner and as supervising physician I was immediately available for consultation/collaboration.   Lyanne Co, MD 05/17/12 [phone]

## 2013-07-16 ENCOUNTER — Encounter (HOSPITAL_COMMUNITY): Payer: Self-pay | Admitting: Emergency Medicine

## 2013-07-16 ENCOUNTER — Emergency Department (HOSPITAL_COMMUNITY)
Admission: EM | Admit: 2013-07-16 | Discharge: 2013-07-16 | Disposition: A | Discharge status: Home / Self Care | Payer: Self-pay | Attending: Emergency Medicine | Admitting: Emergency Medicine

## 2013-07-16 ENCOUNTER — Emergency Department (HOSPITAL_COMMUNITY): Payer: Self-pay

## 2013-07-16 DIAGNOSIS — Y93A1 Activity, exercise machines primarily for cardiorespiratory conditioning: Secondary | ICD-10-CM | POA: Insufficient documentation

## 2013-07-16 DIAGNOSIS — S63501A Unspecified sprain of right wrist, initial encounter: Secondary | ICD-10-CM

## 2013-07-16 DIAGNOSIS — E669 Obesity, unspecified: Secondary | ICD-10-CM | POA: Insufficient documentation

## 2013-07-16 DIAGNOSIS — Y9239 Other specified sports and athletic area as the place of occurrence of the external cause: Secondary | ICD-10-CM | POA: Insufficient documentation

## 2013-07-16 DIAGNOSIS — Y92838 Other recreation area as the place of occurrence of the external cause: Secondary | ICD-10-CM

## 2013-07-16 DIAGNOSIS — S63509A Unspecified sprain of unspecified wrist, initial encounter: Secondary | ICD-10-CM | POA: Insufficient documentation

## 2013-07-16 DIAGNOSIS — R209 Unspecified disturbances of skin sensation: Secondary | ICD-10-CM | POA: Insufficient documentation

## 2013-07-16 DIAGNOSIS — W1809XA Striking against other object with subsequent fall, initial encounter: Secondary | ICD-10-CM | POA: Insufficient documentation

## 2013-07-16 DIAGNOSIS — R296 Repeated falls: Secondary | ICD-10-CM | POA: Insufficient documentation

## 2013-07-16 NOTE — ED Provider Notes (Signed)
CSN: [id number]     Arrival date & time 07/16/13  1155 History  This chart was scribed for non-physician practitioner Raymon MuttonMarissa Daulton Harbaugh, PA-C, working with Donnetta HutchingBrian Cook, MD, by Yevette EdwardsAngela Bracken, ED Scribe. This patient was seen in room WTR7/WTR7 and the patient's care was started at 12:36 PM.   First MD Initiated Contact with Patient 07/16/13 1203     Chief Complaint  Patient presents with  . Wrist Injury    right    The history is provided by the patient. No language interpreter was used.   HPI Comments: Kelly Holloway is a 38 y.o. female who presents to the Emergency Department complaining of a fall which occurred five days ago at the gym when she fell off the treadmill. She hit her head in the fall, but she denies LOC, dizziness, blurred vision, sudden loss of vision, or a persistent headache. The pt is unsure of how she landed on her wrist in the fall, but she is complaining of constant pain to her right wrist. She rates the pain as 7/10 or 8/10, and she characterizes the pain as "aching." Supination of the wrist increases the pain.  The pain was radiating to her forearm and right pinkie finger, but the pain has lessened. She also reports improving paresthesia/numbness to the right pinkie finger. Kelly Holloway denies pain to her elbow.    Past Medical History  Diagnosis Date  . Obesity    Past Surgical History  Procedure Laterality Date  . Tubal ligation    . Tonsillectomy    . Gallstones     No family history on file. History  Substance Use Topics  . Smoking status: Never Smoker   . Smokeless tobacco: Not on file  . Alcohol Use: No   No OB history provided.  Review of Systems  Eyes: Negative for visual disturbance.  Musculoskeletal: Positive for arthralgias.  Neurological: Positive for numbness and headaches (Resolved). Negative for dizziness and syncope.  All other systems reviewed and are negative.   Allergies  Review of patient's allergies indicates no known  allergies.  Home Medications  No current outpatient prescriptions on file.  Triage Vitals: BP 131/68  Pulse 70  Temp(Src) 98.1 F (36.7 C) (Oral)  Resp 23  SpO2 98%  LMP 07/10/2013  Physical Exam  Nursing note and vitals reviewed. Constitutional: She is oriented to person, place, and time. She appears well-developed and well-nourished. No distress.  HENT:  Head: Normocephalic and atraumatic.  Eyes: Conjunctivae and EOM are normal. Pupils are equal, round, and reactive to light. Right eye exhibits no discharge. Left eye exhibits no discharge.  Neck: Normal range of motion. Neck supple. No tracheal deviation present.  Cardiovascular: Normal rate, regular rhythm and normal heart sounds.  Exam reveals no friction rub.   No murmur heard. Pulmonary/Chest: Effort normal. No respiratory distress.  Musculoskeletal: Normal range of motion. She exhibits tenderness.  Mild swelling circumferentially to the right wrist with negative erythema, inflammation, lesions, sores, red streaks, cellulitic findings. Decreased range of motion secondary to pain is decreased flexion, extension, ulnar and radial deviation. Full range of motion to the digits without difficulty. Patient is able to make a fist. Full range of motion to the elbow.  Lymphadenopathy:    She has no cervical adenopathy.  Neurological: She is alert and oriented to person, place, and time. She exhibits normal muscle tone. Coordination normal.  Strength 5+/5+ to upper extremities bilaterally with resistance applied, equal distribution noted Strength intact to MCP, PIP, DIP  joints of right hand Sensation intact with differentiation sharp and dull touch Patient able to make a fist Equal grip strength  Skin: Skin is warm and dry.  Psychiatric: She has a normal mood and affect. Her behavior is normal.    ED Course  Procedures (including critical care time)  DIAGNOSTIC STUDIES: Oxygen Saturation is 98% on room air, normal by my  interpretation.    COORDINATION OF CARE:  12:42 PM- Discussed treatment plan with patient, which includes imaging, and the patient agreed to the plan.   Labs Review Labs Reviewed - No data to display Imaging Review Dg Wrist Complete Right  07/16/2013   CLINICAL DATA:  WRIST INJURY  EXAM: RIGHT WRIST - COMPLETE 3+ VIEW  COMPARISON:  None.  FINDINGS: There is no evidence of fracture or dislocation. There is no evidence of arthropathy or other focal bone abnormality. Soft tissues are unremarkable.  IMPRESSION: Negative.   Electronically Signed   By: Salome Holmes M.D.   On: 07/16/2013 12:46   Dg Finger Little Right  07/16/2013   CLINICAL DATA:  fall  EXAM: RIGHT LITTLE FINGER 2+V  COMPARISON:  DG HAND COMPLETE*R* dated 11/11/2004  FINDINGS: There is no evidence of fracture or dislocation. There is no evidence of arthropathy or other focal bone abnormality. Soft tissues are unremarkable.  IMPRESSION: Negative.   Electronically Signed   By: Salome Holmes M.D.   On: 07/16/2013 12:46    EKG Interpretation   None       MDM   Final diagnoses:  Sprain of wrist, right   Filed Vitals:   07/16/13 1204  BP: 131/68  Pulse: 70  Temp: 98.1 F (36.7 C)  TempSrc: Oral  Resp: 23  SpO2: 98%   I personally performed the services described in this documentation, which was scribed in my presence. The recorded information has been reviewed and is accurate.  Patient presenting to emergency department with right wrist pain starting on Tuesday when the patient fell while at the gym. Reported that while she was on the treadmill she accidentally pressed the emergency stop which resulted in the patient falling backwards and landing on her right wrist. Stated that she didn't hit her head-denied headache, blurred vision, loss of consciousness, blackout, nausea, vomiting. Reported that the wrist pain is localized to the right wrist without radiation described as a soreness aching sensation that is  constant. Alert and oriented. GCS 15. Heart rate and rhythm normal. Lungs clear to auscultation. Radial pulses 2+ bilaterally circumferential swelling localized to the right wrist with decreased range of motion secondary to pain. Full range of motion to the digits of the right hand without difficulty. Full range of motion to the right elbow without difficulty. Proper supination and pronation. Strength intact MCP, PIP, DIP joints of the right hand. Sensation intact with differentiation sharp and dull touch. Patient is able to make a fist without difficulty. Equal grip strength. Strength intact with equal distribution to upper extremities bilaterally. Plain film of right wrist negative for acute osseous injury-negative dislocation. Right Little finger negative findings noted. Suspicion to be wrist sprain. Negative findings for acute osseous injury. Patient placed in wrist brace for comfort. Patient stable, afebrile. Discharged patient. Discussed with patient to rest, ice, elevate. Discussed with patient to keep wrist brace on at all times. Discussed with patient to avoid any physical or strenuous activity. Discussed with patient to continue to use Tylenol/ibuprofen as needed. Discussed with patient to closely monitor symptoms and if symptoms are to  worsen or change to report back to the ED - strict return instructions given.  Patient agreed to plan of care, understood, all questions answered.    Raymon Mutton, PA-C 07/17/13 1147

## 2013-07-16 NOTE — ED Notes (Signed)
Pt states was at the gym, went to grab phone, accidentally hit emergency button and fell backwards, landing/catching self w/ right hand, complaining of R wrist pain, and R pinkie finger pain, states it was numb in R pinkie finger but feeling is coming back, pt states she did hit her head, but denies headaches, denies blurred vision.

## 2013-07-16 NOTE — ED Notes (Signed)
Pt states she has been taking 400mg  ibuprofen, worked for a couple days but now not helping w/ pain, pt has ace wrap on R wrist.

## 2013-07-16 NOTE — Discharge Instructions (Signed)
Please call your doctor for a followup appointment within 24-48 hours. When you talk to your doctor please let them know that you were seen in the emergency department and have them acquire all of your records so that they can discuss the findings with you and formulate a treatment plan to fully care for your new and ongoing problems. Please keep wrist brace on at all times-especially when active and sleeping Please ice, rest, elevate Please avoid any physical or strenuous activity Please continue to use Tylenol as needed for pain relief-no more than 4 g/4000 mg per day Please continue to monitor symptoms closely and if symptoms are to worsen or change (fever greater than 101, worsening pain, chest pain, shortness of breath, swelling, redness, warmth to touch, fall, injury, numbness, tingling, inability grasp objects, weakness) please report back to emergency department immediately  Wrist Sprain with Rehab A sprain is an injury in which a ligament that maintains the proper alignment of a joint is partially or completely torn. The ligaments of the wrist are susceptible to sprains. Sprains are classified into three categories. Grade 1 sprains cause pain, but the tendon is not lengthened. Grade 2 sprains include a lengthened ligament because the ligament is stretched or partially ruptured. With grade 2 sprains there is still function, although the function may be diminished. Grade 3 sprains are characterized by a complete tear of the tendon or muscle, and function is usually impaired. SYMPTOMS   Pain tenderness, inflammation, and/or bruising (contusion) of the injury.  A "pop" or tear felt and/or heard at the time of injury.  Decreased wrist function. CAUSES  A wrist sprain occurs when a force is placed on one or more ligaments that is greater than it/they can withstand. Common mechanisms of injury include:  Catching a ball with you hands.  Repetitive and/ or strenuous extension or flexion of the  wrist. RISK INCREASES WITH:  Previous wrist injury.  Contact sports (boxing or wrestling).  Activities in which falling is common.  Poor strength and flexibility.  Improperly fitted or padded protective equipment. PREVENTION  Warm up and stretch properly before activity.  Allow for adequate recovery between workouts.  Maintain physical fitness:  Strength, flexibility, and endurance.  Cardiovascular fitness.  Protect the wrist joint by limiting its motion with the use of taping, braces, or splints.  Protect the wrist after injury for 6 to 12 months. PROGNOSIS  The prognosis for wrist sprains depends on the degree of injury. Grade 1 sprains require 2 to 6 weeks of treatment. Grade 2 sprains require 6 to 8 weeks of treatment, and grade 3 sprains require up to 12 weeks.  RELATED COMPLICATIONS   Prolonged healing time, if improperly treated or re-injured.  Recurrent symptoms that result in a chronic problem.  Injury to nearby structures (bone, cartilage, nerves, or tendons).  Arthritis of the wrist.  Inability to compete in athletics at a high level.  Wrist stiffness or weakness.  Progression to a complete rupture of the ligament. TREATMENT  Treatment initially involves resting from any activities that aggravate the symptoms, and the use of ice and medications to help reduce pain and inflammation. Your caregiver may recommend immobilizing the wrist for a period of time in order to reduce stress on the ligament and allow for healing. After immobilization it is important to perform strengthening and stretching exercises to help regain strength and a full range of motion. These exercises may be completed at home or with a therapist. Surgery is not usually required  for wrist sprains, unless the ligament has been ruptured (grade 3 sprain). MEDICATION   If pain medication is necessary, then nonsteroidal anti-inflammatory medications, such as aspirin and ibuprofen, or other minor  pain relievers, such as acetaminophen, are often recommended.  Do not take pain medication for 7 days before surgery.  Prescription pain relievers may be given if deemed necessary by your caregiver. Use only as directed and only as much as you need. HEAT AND COLD  Cold treatment (icing) relieves pain and reduces inflammation. Cold treatment should be applied for 10 to 15 minutes every 2 to 3 hours for inflammation and pain and immediately after any activity that aggravates your symptoms. Use ice packs or massage the area with a piece of ice (ice massage).  Heat treatment may be used prior to performing the stretching and strengthening activities prescribed by your caregiver, physical therapist, or athletic trainer. Use a heat pack or soak your injury in warm water. SEEK MEDICAL CARE IF:  Treatment seems to offer no benefit, or the condition worsens.  Any medications produce adverse side effects. EXERCISES RANGE OF MOTION (ROM) AND STRETCHING EXERCISES - Wrist Sprain  These exercises may help you when beginning to rehabilitate your injury. Your symptoms may resolve with or without further involvement from your physician, physical therapist or athletic trainer. While completing these exercises, remember:   Restoring tissue flexibility helps normal motion to return to the joints. This allows healthier, less painful movement and activity.  An effective stretch should be held for at least 30 seconds.  A stretch should never be painful. You should only feel a gentle lengthening or release in the stretched tissue. RANGE OF MOTION  Wrist Flexion, Active-Assisted  Extend your right / left elbow with your fingers pointing down.*  Gently pull the back of your hand towards you until you feel a gentle stretch on the top of your forearm.  Hold this position for __________ seconds. Repeat __________ times. Complete this exercise __________ times per day.  *If directed by your physician, physical  therapist or athletic trainer, complete this stretch with your elbow bent rather than extended. RANGE OF MOTION  Wrist Extension, Active-Assisted  Extend your right / left elbow and turn your palm upwards.*  Gently pull your palm/fingertips back so your wrist extends and your fingers point more toward the ground.  You should feel a gentle stretch on the inside of your forearm.  Hold this position for __________ seconds. Repeat __________ times. Complete this exercise __________ times per day. *If directed by your physician, physical therapist or athletic trainer, complete this stretch with your elbow bent, rather than extended. RANGE OF MOTION  Supination, Active  Stand or sit with your elbows at your side. Bend your right / left elbow to 90 degrees.  Turn your palm upward until you feel a gentle stretch on the inside of your forearm.  Hold this position for __________ seconds. Slowly release and return to the starting position. Repeat __________ times. Complete this stretch __________ times per day.  RANGE OF MOTION  Pronation, Active  Stand or sit with your elbows at your side. Bend your right / left elbow to 90 degrees.  Turn your palm downward until you feel a gentle stretch on the top of your forearm.  Hold this position for __________ seconds. Slowly release and return to the starting position. Repeat __________ times. Complete this stretch __________ times per day.  STRETCH - Wrist Flexion  Place the back of your right / left  hand on a tabletop leaving your elbow slightly bent. Your fingers should point away from your body.  Gently press the back of your hand down onto the table by straightening your elbow. You should feel a stretch on the top of your forearm.  Hold this position for __________ seconds. Repeat __________ times. Complete this stretch __________ times per day.  STRETCH  Wrist Extension  Place your right / left fingertips on a tabletop leaving your elbow  slightly bent. Your fingers should point backwards.  Gently press your fingers and palm down onto the table by straightening your elbow. You should feel a stretch on the inside of your forearm.  Hold this position for __________ seconds. Repeat __________ times. Complete this stretch __________ times per day.  STRENGTHENING EXERCISES - Wrist Sprain These exercises may help you when beginning to rehabilitate your injury. They may resolve your symptoms with or without further involvement from your physician, physical therapist or athletic trainer. While completing these exercises, remember:   Muscles can gain both the endurance and the strength needed for everyday activities through controlled exercises.  Complete these exercises as instructed by your physician, physical therapist or athletic trainer. Progress with the resistance and repetition exercises only as your caregiver advises. STRENGTH Wrist Flexors  Sit with your right / left forearm palm-up and fully supported. Your elbow should be resting below the height of your shoulder. Allow your wrist to extend over the edge of the surface.  Loosely holding a __________ weight or a piece of rubber exercise band/tubing, slowly curl your hand up toward your forearm.  Hold this position for __________ seconds. Slowly lower the wrist back to the starting position in a controlled manner. Repeat __________ times. Complete this exercise __________ times per day.  STRENGTH  Wrist Extensors  Sit with your right / left forearm palm-down and fully supported. Your elbow should be resting below the height of your shoulder. Allow your wrist to extend over the edge of the surface.  Loosely holding a __________ weight or a piece of rubber exercise band/tubing, slowly curl your hand up toward your forearm.  Hold this position for __________ seconds. Slowly lower the wrist back to the starting position in a controlled manner. Repeat __________ times. Complete  this exercise __________ times per day.  STRENGTH - Ulnar Deviators  Stand with a ____________________ weight in your right / left hand, or sit holding on to the rubber exercise band/tubing with your opposite arm supported.  Move your wrist so that your pinkie travels toward your forearm and your thumb moves away from your forearm.  Hold this position for __________ seconds and then slowly lower the wrist back to the starting position. Repeat __________ times. Complete this exercise __________ times per day STRENGTH - Radial Deviators  Stand with a ____________________ weight in your  right / left hand, or sit holding on to the rubber exercise band/tubing with your arm supported.  Raise your hand upward in front of you or pull up on the rubber tubing.  Hold this position for __________ seconds and then slowly lower the wrist back to the starting position. Repeat __________ times. Complete this exercise __________ times per day. STRENGTH  Forearm Supinators  Sit with your right / left forearm supported on a table, keeping your elbow below shoulder height. Rest your hand over the edge, palm down.  Gently grip a hammer or a soup ladle.  Without moving your elbow, slowly turn your palm and hand upward to a "thumbs-up"  position.  Hold this position for __________ seconds. Slowly return to the starting position. Repeat __________ times. Complete this exercise __________ times per day.  STRENGTH  Forearm Pronators  Sit with your right / left forearm supported on a table, keeping your elbow below shoulder height. Rest your hand over the edge, palm up.  Gently grip a hammer or a soup ladle.  Without moving your elbow, slowly turn your palm and hand upward to a "thumbs-up" position.  Hold this position for __________ seconds. Slowly return to the starting position. Repeat __________ times. Complete this exercise __________ times per day.  STRENGTH - Grip  Grasp a tennis ball, a dense  sponge, or a large, rolled sock in your hand.  Squeeze as hard as you can without increasing any pain.  Hold this position for __________ seconds. Release your grip slowly. Repeat __________ times. Complete this exercise __________ times per day.  Document Released: 05/18/2005 Document Revised: 08/10/2011 Document Reviewed: 08/30/2008 [facility] Patient Information 2014 [place], [place].   Emergency Department Resource Guide 1) Find a Doctor and Pay Out of Pocket Although you won't have to find out who is covered by your insurance plan, it is a good idea to ask around and get recommendations. You will then need to call the office and see if the doctor you have chosen will accept you as a new patient and what types of options they offer for patients who are self-pay. Some doctors offer discounts or will set up payment plans for their patients who do not have insurance, but you will need to ask so you aren't surprised when you get to your appointment.  2) Contact Your Local Health Department Not all health departments have doctors that can see patients for sick visits, but many do, so it is worth a call to see if yours does. If you don't know where your local health department is, you can check in your phone book. The CDC also has a tool to help you locate your state's health department, and many state websites also have listings of all of their local health departments.  3) Find a Walk-in Clinic If your illness is not likely to be very severe or complicated, you may want to try a walk in clinic. These are popping up all over the country in pharmacies, drugstores, and shopping centers. They're usually staffed by nurse practitioners or physician assistants that have been trained to treat common illnesses and complaints. They're usually fairly quick and inexpensive. However, if you have serious medical issues or chronic medical problems, these are probably not your best option.  No Primary Care  Doctor: - Call Health Connect at  [phone] - they can help you locate a primary care doctor that  accepts your insurance, provides certain services, etc. - Physician Referral Service- [phone]  Chronic Pain Problems: Organization         Address  Phone   Notes  [facility]  [phone] Patients need to be referred by their primary care doctor.   Medication Assistance: Organization         Address  Phone   Notes  [facility] Medication [facility] [address] [place] [place], [place] [address] [phone] --Must be a resident of [facility] -- Must have NO insurance coverage whatsoever (no Medicaid/ Medicare, etc.) -- The pt. MUST have a primary care doctor that directs their care regularly and follows them in the community   [facility]ist  [phone]  [employer]  ([phone] [phone]   ° °Agencies that provide inexpensive medical care: °Organization         Address  Phone   Notes  °[facility]  ([phone]) [phone]   °[facility]    ([phone]) 832-7272   °[facility] [address] °[place], [place] [address] ([phone]) [phone]   °[facility] [address], °[place] (336) [phone]   °[facility]    (336) 373-0678   °[facility]    (336) 272-1050   °[facility] ° [address], [place] Phone:  ([phone]) [phone], Fax:  ([phone]) [phone] Hours of Operation:  9 am - 6 pm, M-F.  Also accepts Medicaid/Medicare and self-pay.  °[facility] ° [address], [place] Phone: ([phone]) [phone], Fax: ([phone]) [phone]. Hours of Operation:  8:30 am - 5:30 pm, M-F.  Also accepts Medicaid and self-pay.  °[facility] [place] [address], [place] Phone: ([phone]) [phone]   °[facility] [address], [place], [place] (336)[phone], Ext. 123 Mondays & Thursdays: 7-9 AM.  First 15 patients are seen on a first  come, first serve basis. °  ° °Medicaid-accepting [place] Providers: ° °Organization         Address  Phone   Notes  °[facility] [address], [place] ([phone]) [phone] Also accepts self-pay patients.  °[facility] [address], [place] ° (336) [phone]   °[facility] [address], [place] (336) [phone]   °[facility] [address] [place] [address], [place] (336) [phone]   °Veita Bland [address], [place]  ° ([phone]) [phone] Only accepts [place] Access Medicaid patients after they have their name applied to their card.  ° °Self-Pay (no insurance) in [place]: ° °Organization         Address  Phone   Notes  °Sickle Cell Patients, [facility] [address], [place] (336) [phone]   °[facility] [address], [place] (336) [phone]   °[facility] ° [address], [place] (336) [phone]   °[facility]/Dr. Osei-Bonsu ° [address] [place] [address], [place] or [address], [place] ([phone]) [phone] Phone number for both [place] and [place] locations is the same.  °[facility] [address], [place] ([phone]) [phone]   °[facility] [address] [place] [address], [place] or [address] (336) 852-7530 °(336) 878-2260   °[facility] [address], [place] (336) 350-1642, phone; (336) [phone], fax Sees patients 1st and 3rd Saturday of every month.  Must not qualify for public or private insurance (i.e. Medicaid, Medicare, [place] Health Choice, Veterans' Benefits) • Household income should be no more than 200% of the poverty level •The clinic cannot treat you if you are pregnant or think you are pregnant • Sexually transmitted diseases are not treated at the clinic.  ° ° °Dental Care: °Organization          Address  Phone  Notes  °[place] [facility] [address],  (336) [phone] Accepts children up to age 21 who are enrolled in Medicaid or [place] Health Choice; pregnant women with a Medicaid card;   and children who have applied for Medicaid or [place] Health Choice, but were declined, whose parents can pay a reduced fee at time of service.  °[place] [facility] [place]  [address], [place] (336) 641-7733 Accepts children up to age 21 who are enrolled in Medicaid or [place] Health Choice; pregnant women with a Medicaid card; and children who have applied for Medicaid or [place] Health Choice, but were declined, whose parents can pay a reduced fee at time of service.  °[facility] ° [address], [place] ([phone]) [phone] Patients are seen by appointment only. Walk-ins are not accepted. [facility] will see patients 18 years of age and older. °Monday - Tuesday (8am-5pm) °Most Wednesdays (8:30-5pm) °$30 per visit, cash only  °[facility] ° [address], [place] ([phone]) [phone] Patients are seen by appointment only. Walk-ins are not accepted. [facility] will see patients 18 years of age and older. °One Wednesday Evening (Monthly: Volunteer Based).  $30 per visit, cash only  °[facility]  ([phone]) [phone] for adults; Children under age 4, call Graduate Pediatric Dentistry at ([phone]. Children aged 4-14, please call [phone] to request a pediatric application. ° Dental services are provided in all areas of dental care including fillings, crowns and bridges, complete and partial dentures, implants, gum treatment, root canals, and extractions. Preventive care is also provided. Treatment is provided to both adults and children. °Patients are selected via a lottery and there is often a waiting list. °  °[facility] [address], °[place] ° (336) 763-8833 [email or web address] °  °[facility] [address], [place], [place] (336)723-1848, Ext. 123 Second and Fourth Thursday of each month, opens at 6:30 AM; Clinic ends at 9 AM.  Patients are seen on a first-come first-served basis, and a limited number are seen during each clinic.  ° °[facility] ° [address], [place], [place] (336) [phone]   Eligibility Requirements °You must have lived in [place], [place], or [place] counties for at least the last three months. °  You cannot be eligible for state or federal sponsored healthcare insurance, including Veterans Administration, Medicaid, or Medicare. °  You generally cannot be eligible for healthcare insurance through your employer.  °  How to apply: °Eligibility screenings are held every Tuesday and Wednesday afternoon from 1:00 pm until 4:00 pm. You do not need an appointment for the interview!  °[facility] [address], [place], [place] 336-631-2330   °[facility]  336-342-8273   °[facility]  336-703-3100   °[facility]  336-570-6415   ° °Behavioral Health Resources in the Community: °Intensive Outpatient Programs °Organization         Address  Phone  Notes  °[facility] [address], [place], [place] 336-878-6098   °[facility] [address], [place], [place] 336-832-9800   °[facility] [address], [place], [place] ° 336-882-2125   °[place] [address],  °[place], [place] 1-800-853-5163 or 336-641-4981   °Substance Abuse Resources °Organization         Address  Phone  Notes  °[facility]  336-882-2125   °[facility]  336-784-9470   °[facility]  336-285-9073   °[facility]  336-845-3988   °Residential &   Outpatient Substance Abuse Program  559-360-5665   Psychological  Services Organization         Address  Phone  Notes  [facility]  [place]  480-787-1381   [place] [address] [place] or [phone]    Mobile Crisis Teams Organization         Address  Phone  Notes  [facility], Mobile Crisis Care Unit  660-804-9958   [facility]  [address] [place], [place]   [place] [address] [place] 951-013-4189    Self-Help/Support Groups Organization         Address  Phone             Notes  [place] [place] - variety of support groups  [place] Call for more information  Narcotics Anonymous (NA), [facility] [address], [employer] [place]  2 meetings at this location   Special educational needs teacher         Address  Phone  Notes  [facility] [place],    [place]  1-(609)675-5303   [facility]  [address] [place] [phone], [place], [place]   [place], [place] [phone] Admissions: 8am-3pm M-F  [facility]stance [place] [address],    [place], [place] [phone]   [facility] [address] [place], [place], [place]   [facility] [address]  [place], [place]   [facility] - Intensive Outpatient [address], [place], [place]   [facility] [address]  [place], [place] 1-252-345-1412 or 704-630-7376   [facility] [address] [place], [place] Accepts Medicaid  [facility] [address]  [place] 1-214-613-5363 Substance Abuse/Addiction Treatment   [facility] Organization         Address  Phone  Notes  [facility]  4780512695   Domenic Schwab, PhD [address] [place], [place]   7813929289 or [phone]   [place], [place] [phone]   [facility] [address] [place], [place] (857)008-6966 Insurance/Medicaid/sponsorship through [facility] and Families [address] [place]                                    [place], [place] [phone] [place] [address], [place] [phone]    Dr. Adele Schilder  [phone]   [facility] Dept. 1) [address] [place] 2) [place] 3)  [place] [address], [place] [phone]  [phone]   [place] [phone] or [phone] (After Hours)

## 2013-07-18 NOTE — ED Provider Notes (Signed)
Medical screening examination/treatment/procedure(s) were performed by non-physician practitioner and as supervising physician I was immediately available for consultation/collaboration.  EKG Interpretation   None        Kervens Roper, MD 07/18/13 1445 

## 2013-12-31 ENCOUNTER — Emergency Department (HOSPITAL_COMMUNITY): Payer: Self-pay

## 2013-12-31 ENCOUNTER — Emergency Department (INDEPENDENT_AMBULATORY_CARE_PROVIDER_SITE_OTHER)
Admission: EM | Admit: 2013-12-31 | Discharge: 2013-12-31 | Disposition: A | Discharge status: Home / Self Care | Payer: Self-pay | Source: Home / Self Care | Attending: Family Medicine | Admitting: Family Medicine

## 2013-12-31 ENCOUNTER — Emergency Department (INDEPENDENT_AMBULATORY_CARE_PROVIDER_SITE_OTHER): Payer: Self-pay

## 2013-12-31 ENCOUNTER — Encounter (HOSPITAL_COMMUNITY): Payer: Self-pay | Admitting: Emergency Medicine

## 2013-12-31 DIAGNOSIS — R109 Unspecified abdominal pain: Secondary | ICD-10-CM

## 2013-12-31 LAB — POCT URINALYSIS DIP (DEVICE)
Bilirubin Urine: NEGATIVE
GLUCOSE, UA: NEGATIVE mg/dL
HGB URINE DIPSTICK: NEGATIVE
Ketones, ur: NEGATIVE mg/dL
NITRITE: NEGATIVE
PH: 5 (ref 5.0–8.0)
Protein, ur: NEGATIVE mg/dL
Specific Gravity, Urine: >=1.03 (ref 1.005–1.030)
Urobilinogen, UA: 0.2 mg/dL (ref 0.0–1.0)

## 2013-12-31 LAB — D-DIMER, QUANTITATIVE (NOT AT ARMC): D DIMER QUANT: 0.29 ug{FEU}/mL (ref 0.00–0.48)

## 2013-12-31 LAB — POCT PREGNANCY, URINE: Preg Test, Ur: NEGATIVE

## 2013-12-31 MED ORDER — CYCLOBENZAPRINE HCL 5 MG PO TABS: 5.0000 mg | ORAL_TABLET | Freq: Three times a day (TID) | ORAL | Status: DC | PRN

## 2013-12-31 MED ORDER — NAPROXEN 500 MG PO TABS: 500.0000 mg | ORAL_TABLET | Freq: Two times a day (BID) | ORAL | Status: DC

## 2013-12-31 NOTE — ED Notes (Signed)
C/o pain in left flank since 8-28, no known injury

## 2013-12-31 NOTE — Discharge Instructions (Signed)
Your urine studies were normal. Your chest xray was normal. And your blood work (blood testing for possible blood clot) was negative. You do not appear to have urine or kidney infection. Your discomfort seems to be related to muscle strain and/or muscle spasm. I would recommend using medications prescribed and if symptoms do not improve, may follow up for re-evaluation. If symptoms become suddenly worse or severe, please seek re-evaluation at your nearest emergency room. If your urine culture indicates you need additional treatment, you will be notified by phone.   Flank Pain Flank pain refers to pain that is located on the side of the body between the upper abdomen and the back. The pain may occur over a short period of time (acute) or may be long-term or reoccurring (chronic). It may be mild or severe. Flank pain can be caused by many things. CAUSES  Some of the more common causes of flank pain include:  Muscle strains.   Muscle spasms.   A disease of your spine (vertebral disk disease).   A lung infection (pneumonia).   Fluid around your lungs (pulmonary edema).   A kidney infection.   Kidney stones.   A very painful skin rash caused by the chickenpox virus (shingles).   Gallbladder disease.  HOME CARE INSTRUCTIONS  Home care will depend on the cause of your pain. In general,  Rest as directed by your caregiver.  Drink enough fluids to keep your urine clear or pale yellow.  Only take over-the-counter or prescription medicines as directed by your caregiver. Some medicines may help relieve the pain.  Tell your caregiver about any changes in your pain.  Follow up with your caregiver as directed. SEEK IMMEDIATE MEDICAL CARE IF:   Your pain is not controlled with medicine.   You have new or worsening symptoms.  Your pain increases.   You have abdominal pain.   You have shortness of breath.   You have persistent nausea or vomiting.   You have swelling in  your abdomen.   You feel faint or pass out.   You have blood in your urine.  You have a fever or persistent symptoms for more than 2-3 days.  You have a fever and your symptoms suddenly get worse. MAKE SURE YOU:   Understand these instructions.  Will watch your condition.  Will get help right away if you are not doing well or get worse. Document Released: 07/09/2005 Document Revised: 02/10/2012 Document Reviewed: 12/31/2011 [facility] Patient Information 2015 [place], [place]. This information is not intended to replace advice given to you by your health care provider. Make sure you discuss any questions you have with your health care provider.

## 2013-12-31 NOTE — ED Provider Notes (Signed)
Medical screening examination/treatment/procedure(s) were performed by resident physician or non-physician practitioner and as supervising physician I was immediately available for consultation/collaboration.   KINDL,JAMES DOUGLAS MD.   James D Kindl, MD 12/31/13 1746 

## 2013-12-31 NOTE — ED Provider Notes (Signed)
CSN: [id number]     Arrival date & time 12/31/13  1326 History   First MD Initiated Contact with Patient 12/31/13 1343     Chief Complaint  Patient presents with  . Flank Pain   (Consider location/radiation/quality/duration/timing/severity/associated sxs/prior Treatment) HPI Comments: Morbidly obese female presents with 4 day history of left flank discomfort. 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 Denies previous episodes, falls, injury, GI sx, GU sx, cough, chest pain, dyspnea, fever, rash. States pain comes in waves and when pain intensifies, it become uncomfortable for her to take a deep breath. States pain is made worse with sitting and with lying on her left side and is made better by lying in supine position.  PCP: none LNMP: mid July 2015  The history is provided by the patient.    Past Medical History  Diagnosis Date  . Obesity    Past Surgical History  Procedure Laterality Date  . Tubal ligation    . Tonsillectomy    . Gallstones     History reviewed. No pertinent family history. History  Substance Use Topics  . Smoking status: Never Smoker   . Smokeless tobacco: Not on file  . Alcohol Use: No   OB History   Grav Para Term Preterm Abortions TAB SAB Ect Mult Living                 Review of Systems  All other systems reviewed and are negative.   Allergies  Review of patient's allergies indicates no known allergies.  Home Medications   Prior to Admission medications   Medication Sig Start Date End Date Taking? Authorizing Provider  cyclobenzaprine (FLEXERIL) 5 MG tablet Take 1 tablet (5 mg total) by mouth 3 (three) times daily as needed for muscle spasms (and back pain). 12/31/13   Ardis Rowan, PA  naproxen (NAPROSYN) 500 MG tablet Take 1 tablet (500 mg total) by mouth 2 (two) times daily with a meal. As needed for back pain 12/31/13   Jess Barters Trigo Winterbottom, PA   BP 127/81  Pulse 85  Temp(Src) 99.1 F (37.3 C) (Oral)  Resp  18  SpO2 98%  LMP 12/13/2013 Physical Exam  Nursing note and vitals reviewed. Constitutional: She is oriented to person, place, and time. She appears well-developed and well-nourished. No distress.  HENT:  Head: Normocephalic and atraumatic.  Eyes: Conjunctivae are normal. No scleral icterus.  Neck: Normal range of motion. Neck supple.  Cardiovascular: Normal rate, regular rhythm and normal heart sounds.   Pulmonary/Chest: Effort normal and breath sounds normal. No respiratory distress. She has no wheezes. Chest wall is not dull to percussion. She exhibits tenderness. She exhibits no mass, no bony tenderness, no laceration, no crepitus, no edema, no deformity, no swelling and no retraction.    Abdominal: Soft. Bowel sounds are normal.  Musculoskeletal: Normal range of motion. She exhibits tenderness. She exhibits no edema.       Back:  Neurological: She is alert and oriented to person, place, and time.  Skin: Skin is warm and dry. No rash noted. No erythema.  Psychiatric: She has a normal mood and affect. Her behavior is normal.    ED Course  Procedures (including critical care time) Labs Review Labs Reviewed  POCT URINALYSIS DIP (DEVICE) - Abnormal; Notable for the following:    Leukocytes, UA TRACE (*)    All other components within normal limits  URINE CULTURE  D-DIMER, QUANTITATIVE  POCT PREGNANCY, URINE  Imaging Review Dg Chest 2 View  12/31/2013   CLINICAL DATA:  Left-sided flank pain and inspiratory chest pain  EXAM: CHEST  2 VIEW  COMPARISON:  09/24/2003  FINDINGS: The heart size and mediastinal contours are within normal limits. Lungs are hypoaerated with crowding of the bronchovascular markings. Both lungs are clear. The visualized skeletal structures are unremarkable.  IMPRESSION: No active cardiopulmonary disease.   Electronically Signed   By: Christiana PellantGretchen  Green M.D.   On: 12/31/2013 14:45     MDM   1. Flank pain    CXR unremarkable D-dimer negative UPT  negative UA unremarkable Will treat for myofascial strain of left flank and chest wall with naprosyn and flexeril and advise follow up if no improvement.      Jess BartersJennifer Lee [place] Lakes[place], [place] 12/31/13 [phone]

## 2014-01-01 LAB — URINE CULTURE: Special Requests: NORMAL

## 2015-08-30 ENCOUNTER — Emergency Department (HOSPITAL_COMMUNITY)
Admission: EM | Admit: 2015-08-30 | Discharge: 2015-08-30 | Disposition: A | Discharge status: Home / Self Care | Payer: Medicaid Other | Attending: Emergency Medicine | Admitting: Emergency Medicine

## 2015-08-30 ENCOUNTER — Encounter (HOSPITAL_COMMUNITY): Payer: Self-pay | Admitting: Emergency Medicine

## 2015-08-30 DIAGNOSIS — L03011 Cellulitis of right finger: Secondary | ICD-10-CM | POA: Diagnosis not present

## 2015-08-30 DIAGNOSIS — E669 Obesity, unspecified: Secondary | ICD-10-CM | POA: Insufficient documentation

## 2015-08-30 DIAGNOSIS — L608 Other nail disorders: Secondary | ICD-10-CM | POA: Diagnosis present

## 2015-08-30 MED ORDER — CEPHALEXIN 500 MG PO CAPS: 500.0000 mg | ORAL_CAPSULE | Freq: Four times a day (QID) | ORAL | Status: DC

## 2015-08-30 MED ORDER — BUPIVACAINE HCL (PF) 0.25 % IJ SOLN
10.0000 mL | Freq: Once | INTRAMUSCULAR | Status: AC
  Administered 2015-08-30: 2 mL
  Filled 2015-08-30: qty 30

## 2015-08-30 MED ORDER — HYDROCODONE-ACETAMINOPHEN 5-325 MG PO TABS: 1.0000 | ORAL_TABLET | ORAL | Status: DC | PRN

## 2015-08-30 NOTE — ED Notes (Signed)
Per pt, states right ring finger is swollen and possibly infected

## 2015-08-30 NOTE — Discharge Instructions (Signed)
Paronychia °Paronychia is an infection of the skin that surrounds a nail. It usually affects the skin around a fingernail, but it may also occur near a toenail. It often causes pain and swelling around the nail. This condition may come on suddenly or develop over a longer period. In some cases, a collection of pus (abscess) can form near or under the nail. Usually, paronychia is not serious and it clears up with treatment. °CAUSES °This condition may be caused by bacteria or fungi. It is commonly caused by either Streptococcus or Staphylococcus bacteria. The bacteria or fungi often cause the infection by getting into the affected area through an opening in the skin, such as a cut or a hangnail. °RISK FACTORS °This condition is more likely to develop in: °· People who get their hands wet often, such as those who work as dishwashers, bartenders, or nurses. °· People who bite their fingernails or suck their thumbs. °· People who trim their nails too short. °· People who have hangnails or injured fingertips. °· People who get manicures. °· People who have diabetes. °SYMPTOMS °Symptoms of this condition include: °· Redness and swelling of the skin near the nail. °· Tenderness around the nail when you touch the area. °· Pus-filled bumps under the cuticle. The cuticle is the skin at the base or sides of the nail. °· Fluid or pus under the nail. °· Throbbing pain in the area. °DIAGNOSIS °This condition is usually diagnosed with a physical exam. In some cases, a sample of pus may be taken from an abscess to be tested in a lab. This can help to determine what type of bacteria or fungi is causing the condition. °TREATMENT °Treatment for this condition depends on the cause and severity of the condition. If the condition is mild, it may clear up on its own in a few days. Your health care provider may recommend soaking the affected area in warm water a few times a day. When treatment is needed, the options may  include: °· Antibiotic medicine, if the condition is caused by a bacterial infection. °· Antifungal medicine, if the condition is caused by a fungal infection. °· Incision and drainage, if an abscess is present. In this procedure, the health care provider will cut open the abscess so the pus can drain out. °HOME CARE INSTRUCTIONS °· Soak the affected area in warm water if directed to do so by your health care provider. You may be told to do this for 20 minutes, 2-3 times a day. Keep the area dry in between soakings. °· Take medicines only as directed by your health care provider. °· If you were prescribed an antibiotic medicine, finish all of it even if you start to feel better. °· Keep the affected area clean. °· Do not try to drain a fluid-filled bump yourself. °· If you will be washing dishes or performing other tasks that require your hands to get wet, wear rubber gloves. You should also wear gloves if your hands might come in contact with irritating substances, such as cleaners or chemicals. °· Follow your health care provider's instructions about: °¨ Wound care. °¨ Bandage (dressing) changes and removal. °SEEK MEDICAL CARE IF: °· Your symptoms get worse or do not improve with treatment. °· You have a fever or chills. °· You have redness spreading from the affected area. °· You have continued or increased fluid, blood, or pus coming from the affected area. °· Your finger or knuckle becomes swollen or is difficult to move. °  °  This information is not intended to replace advice given to you by your health care provider. Make sure you discuss any questions you have with your health care provider. °  °Document Released: 11/11/2000 Document Revised: 10/02/2014 Document Reviewed: 04/25/2014 °Elsevier Interactive Patient Education ©2016 [employer] Inc. ° °

## 2015-08-30 NOTE — ED Provider Notes (Addendum)
CSN: [id number]     Arrival date & time 08/30/15  0847 History   First MD Initiated Contact with Patient 08/30/15 0901     Chief Complaint  Patient presents with  . Finger Injury     (Consider location/radiation/quality/duration/timing/severity/associated sxs/prior Treatment) HPI Comments: Patient here complaining of one-week history of swelling to the right ring finger. Notes discoloration under the nail bed without fever or chills. No drainage noted. Does wear artificial nails. Denies any prior history of same. Has had erythema to the distal portion of her finger. We moved the artificial nail and symptoms consist. No treatment for this used prior to arrival  The history is provided by the patient.    Past Medical History  Diagnosis Date  . Obesity    Past Surgical History  Procedure Laterality Date  . Tubal ligation    . Tonsillectomy    . Gallstones     No family history on file. Social History  Substance Use Topics  . Smoking status: Never Smoker   . Smokeless tobacco: None  . Alcohol Use: No   OB History    No data available     Review of Systems  All other systems reviewed and are negative.     Allergies  Review of patient's allergies indicates no known allergies.  Home Medications   Prior to Admission medications   Not on File   BP 139/72 mmHg  Pulse 57  Temp(Src) 97.9 F (36.6 C) (Oral)  Resp 18  SpO2 98%  LMP 08/16/2015 Physical Exam  Constitutional: She is oriented to person, place, and time. She appears well-developed and well-nourished.  Non-toxic appearance. No distress.  HENT:  Head: Normocephalic and atraumatic.  Eyes: Conjunctivae, EOM and lids are normal. Pupils are equal, round, and reactive to light.  Neck: Normal range of motion. Neck supple. No tracheal deviation present. No thyroid mass present.  Cardiovascular: Normal rate, regular rhythm and normal heart sounds.  Exam reveals no gallop.   No murmur heard. Pulmonary/Chest: Effort  normal and breath sounds normal. No stridor. No respiratory distress. She has no decreased breath sounds. She has no wheezes. She has no rhonchi. She has no rales.  Abdominal: Soft. Normal appearance and bowel sounds are normal. She exhibits no distension. There is no tenderness. There is no rebound and no CVA tenderness.  Musculoskeletal: Normal range of motion. She exhibits no edema or tenderness.       Hands: Neurological: She is alert and oriented to person, place, and time. She has normal strength. No cranial nerve deficit or sensory deficit. GCS eye subscore is 4. GCS verbal subscore is 5. GCS motor subscore is 6.  Skin: Skin is warm and dry. No abrasion and no rash noted.  Psychiatric: She has a normal mood and affect. Her speech is normal and behavior is normal.  Nursing note and vitals reviewed.   ED Course  .Marland KitchenIncision and Drainage Date/Time: 08/30/2015 12:35 PM Performed by: Lorre Nick Authorized by: Lorre Nick Consent: Verbal consent obtained. Written consent not obtained. Risks and benefits: risks, benefits and alternatives were discussed Consent given by: patient Time out: Immediately prior to procedure a "time out" was called to verify the correct patient, procedure, equipment, support staff and site/side marked as required. Type: abscess Body area: upper extremity Location details: right ring finger Anesthesia: digital block Local anesthetic: bupivacaine 0.25% with epinephrine Anesthetic total: 8 ml Patient sedated: no Risk factor: underlying major nerve Scalpel size: 10 Incision type: single with marsupialization Incision  depth: subcutaneous Complexity: complex Drainage: purulent Drainage amount: moderate Wound treatment: wound left open Packing material: Vaseline gauze Patient tolerance: Patient tolerated the procedure well with no immediate complications   (including critical care time) Labs Review Labs Reviewed - No data to display  Imaging  Review No results found. I have personally reviewed and evaluated these images and lab results as part of my medical decision-making.   EKG Interpretation None      MDM   Final diagnoses:  None    Spoke to the hand surgeon on call, Dr. Janee Mornhompson, who recommends the patient have her nail removed and irrigate the pus underneath. This was performed after the patient received a digital block. Not adhering dressing was placed. Dr. Janee Mornhompson took the patient information and he will follow-up with patient. Patient will be placed on Keflex      Lorre NickAnthony Yuan Gann, MD 08/30/15 1236  Lorre NickAnthony Xee Hollman, MD 08/31/15 [phone]

## 2015-08-30 NOTE — ED Notes (Signed)
MD Allen at bedside.

## 2016-03-08 ENCOUNTER — Encounter (HOSPITAL_COMMUNITY): Payer: Self-pay

## 2016-03-08 ENCOUNTER — Emergency Department (HOSPITAL_COMMUNITY)
Admission: EM | Admit: 2016-03-08 | Discharge: 2016-03-08 | Disposition: A | Discharge status: Home / Self Care | Payer: Medicaid Other | Attending: Emergency Medicine | Admitting: Emergency Medicine

## 2016-03-08 DIAGNOSIS — M545 Low back pain, unspecified: Secondary | ICD-10-CM

## 2016-03-08 DIAGNOSIS — Y999 Unspecified external cause status: Secondary | ICD-10-CM | POA: Diagnosis not present

## 2016-03-08 DIAGNOSIS — M791 Myalgia: Secondary | ICD-10-CM | POA: Diagnosis not present

## 2016-03-08 DIAGNOSIS — M7918 Myalgia, other site: Secondary | ICD-10-CM

## 2016-03-08 DIAGNOSIS — Y939 Activity, unspecified: Secondary | ICD-10-CM | POA: Insufficient documentation

## 2016-03-08 DIAGNOSIS — Y92410 Unspecified street and highway as the place of occurrence of the external cause: Secondary | ICD-10-CM | POA: Diagnosis not present

## 2016-03-08 DIAGNOSIS — S3992XA Unspecified injury of lower back, initial encounter: Secondary | ICD-10-CM | POA: Insufficient documentation

## 2016-03-08 MED ORDER — IBUPROFEN 400 MG PO TABS
600.0000 mg | ORAL_TABLET | Freq: Once | ORAL | Status: AC
  Administered 2016-03-08: 600 mg via ORAL
  Filled 2016-03-08: qty 1

## 2016-03-08 MED ORDER — METHOCARBAMOL 500 MG PO TABS: 500.0000 mg | ORAL_TABLET | Freq: Two times a day (BID) | ORAL | 0 refills | Status: DC

## 2016-03-08 MED ORDER — METHOCARBAMOL 500 MG PO TABS
500.0000 mg | ORAL_TABLET | Freq: Once | ORAL | Status: AC
  Administered 2016-03-08: 500 mg via ORAL
  Filled 2016-03-08: qty 1

## 2016-03-08 NOTE — ED Notes (Signed)
Declined W/C at D/C and was escorted to lobby by RN. 
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
## 2017-11-06 ENCOUNTER — Ambulatory Visit (HOSPITAL_COMMUNITY)
Admission: EM | Admit: 2017-11-06 | Discharge: 2017-11-06 | Disposition: A | Discharge status: Home / Self Care | Payer: Self-pay | Attending: Family | Admitting: Family

## 2017-11-06 ENCOUNTER — Encounter (HOSPITAL_COMMUNITY): Payer: Self-pay | Admitting: *Deleted

## 2017-11-06 ENCOUNTER — Telehealth (HOSPITAL_COMMUNITY): Payer: Self-pay | Admitting: *Deleted

## 2017-11-06 ENCOUNTER — Other Ambulatory Visit: Payer: Self-pay

## 2017-11-06 DIAGNOSIS — E669 Obesity, unspecified: Secondary | ICD-10-CM | POA: Insufficient documentation

## 2017-11-06 DIAGNOSIS — L089 Local infection of the skin and subcutaneous tissue, unspecified: Secondary | ICD-10-CM | POA: Insufficient documentation

## 2017-11-06 DIAGNOSIS — L309 Dermatitis, unspecified: Secondary | ICD-10-CM | POA: Insufficient documentation

## 2017-11-06 MED ORDER — TRIAMCINOLONE ACETONIDE 0.025 % EX CREA: 1.0000 "application " | TOPICAL_CREAM | Freq: Two times a day (BID) | CUTANEOUS | 2 refills | Status: DC

## 2017-11-06 MED ORDER — DOXYCYCLINE HYCLATE 100 MG PO TBEC: 100.0000 mg | DELAYED_RELEASE_TABLET | Freq: Two times a day (BID) | ORAL | 0 refills | Status: DC

## 2017-11-06 MED ORDER — MUPIROCIN 2 % EX OINT: 1.0000 "application " | TOPICAL_OINTMENT | Freq: Two times a day (BID) | CUTANEOUS | 0 refills | Status: DC

## 2017-11-06 NOTE — Telephone Encounter (Signed)
Discussed mupiricin Rx instructions; pt states instructions state to place in nose.  Discussed with Claris CheMargaret, FNP - states Rx accidentally defaulted to those instructions, but verified she wants pt to apply to bilat ears and to continue x 7 days.  Pt notified of correct instructions and verbalized understanding.

## 2017-11-06 NOTE — ED Triage Notes (Addendum)
Reports seasonal rash to chest, BUE, bilat ears x approx 2 wks with worsening.  Also c/o "swollen glands behind my ears".

## 2017-11-06 NOTE — ED Provider Notes (Signed)
[facility]    CSN: [id number] Arrival date & time: 11/06/17  1329     History   Chief Complaint Chief Complaint  Patient presents with  . Rash    HPI Kelly Holloway is a 42 y.o. female.   Chief complaint rash to chest, ears x 2 weeks, worsening.  Reports this occurs seasonally, 'my eczema'. Very dry itchy. Started on left forearm, then between breast. Usually behind knees.  Also endorses congestion, glands swollen. Usually coca butter, vaseline will heal it. steriod cream clears it 'right up.'  No fever, chills, sinus pressure, ear discharge, fever.   Notes bleeding, purulent discharge of ear lobes x 2 weeks, worsening . Tried topical antibiotic with no change.  Allergic to nickel - as ears are red. Rash between breast she suspects necklace which has nickel.    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 Obesity     Patient Active Problem List   Diagnosis Date Noted  . TOBACCO USER 03/09/2009  . OBESITY NOS 03/21/2007  . 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 gallstones    . TONSILLECTOMY    . TUBAL LIGATION      OB History   None      Home Medications    Prior to Admission medications   Medication Sig Start Date End Date Taking? Authorizing Provider  doxycycline (DORYX) 100 MG EC tablet Take 1 tablet (100 mg total) by mouth 2 (two) times daily. 11/06/17   Allegra GranaArnett, Phinneas Shakoor G, FNP  mupirocin ointment (BACTROBAN) 2 % Place 1 application into the nose 2 (two) times daily. 11/06/17   Allegra GranaArnett, Tequita Marrs G, FNP  triamcinolone (KENALOG) 0.025 % cream Apply 1 application topically 2 (two) times daily. 11/06/17   Allegra GranaArnett, Roth Ress G, FNP    Family History Family History  Problem Relation Age of Onset  . Cancer Mother   . Cancer Father     Social History Social History   Tobacco Use  . Smoking status: Never Smoker  . Smokeless  tobacco: Never Used  Substance Use Topics  . Alcohol use: Yes    Comment: occasionally  . Drug use: No     Allergies   Patient has no known allergies.   Review of Systems Review of Systems  Constitutional: Negative for chills and fever.  HENT: Negative for congestion, ear discharge, ear pain, facial swelling, hearing loss and sore throat.   Respiratory: Negative for cough.   Cardiovascular: Negative for chest pain and palpitations.  Gastrointestinal: Negative for nausea and vomiting.  Skin: Positive for rash.     Physical Exam Triage Vital Signs ED Triage Vitals  Enc Vitals Group     BP 11/06/17 1430 120/71     Pulse Rate 11/06/17 1428 63     Resp 11/06/17 1428 16     Temp 11/06/17 1428 98.5 F (36.9 C)     Temp Source 11/06/17 1428 Oral     SpO2 11/06/17 1428 100 %     Weight --      Height --      Head Circumference --      Peak Flow --      Pain Score --      Pain Loc --      Pain Edu? --      Excl.  in GC? --    No data found.  Updated Vital Signs BP 120/71   Pulse 63   Temp 98.5 F (36.9 C) (Oral)   Resp 16   LMP 10/23/2017 (Approximate)   SpO2 100%   Visual Acuity Right Eye Distance:   Left Eye Distance:   Bilateral Distance:    Right Eye Near:   Left Eye Near:    Bilateral Near:     Physical Exam  Constitutional: She appears well-developed and well-nourished.  HENT:  Head: Normocephalic and atraumatic.  Right Ear: Hearing, tympanic membrane, external ear and ear canal normal. There is swelling. No drainage or tenderness. No foreign bodies. Tympanic membrane is not erythematous and not bulging. No middle ear effusion. No decreased hearing is noted.  Left Ear: Hearing, tympanic membrane, external ear and ear canal normal. There is swelling. No drainage or tenderness. No foreign bodies. Tympanic membrane is not erythematous and not bulging.  No middle ear effusion. No decreased hearing is noted.  Ears:  Nose: Nose normal. No rhinorrhea. Right  sinus exhibits no maxillary sinus tenderness and no frontal sinus tenderness. Left sinus exhibits no maxillary sinus tenderness and no frontal sinus tenderness.  Mouth/Throat: Uvula is midline, oropharynx is clear and moist and mucous membranes are normal. No oropharyngeal exudate, posterior oropharyngeal edema, posterior oropharyngeal erythema or tonsillar abscesses.  Erythema, edema,purulent discharge noted bilateral lower auricle of ears as marked on diagram. Wound culture collected  Eyes: Conjunctivae are normal.  Cardiovascular: Regular rhythm, normal heart sounds and normal pulses.  Pulmonary/Chest: Effort normal and breath sounds normal. She has no wheezes. She has no rhonchi. She has no rales.  Dry, scaly, excoriated patch noted between breast as marked on diagram.  No purulent discharge, increased erythema    Lymphadenopathy:       Head (right side): Posterior auricular adenopathy present. No submental, no submandibular, no tonsillar, no preauricular and no occipital adenopathy present.       Head (left side): Posterior auricular adenopathy present. No submental, no submandibular, no tonsillar, no preauricular and no occipital adenopathy present.    She has no cervical adenopathy.  Tender, less than 1 cm, well-circumscribed lymphapathy noted bilateral postauricular area.  Neurological: She is alert.  Skin: Skin is warm and dry. Rash noted. Rash is not vesicular.     Dry, scaly, excoriated patch of skin noted left medial forearm As  Marked on diagram.  No purulent discharge, increased erythema, edema.  Psychiatric: She has a normal mood and affect. Her speech is normal and behavior is normal. Thought content normal.  Vitals reviewed.    UC Treatments / Results  Labs (all labs ordered are listed, but only abnormal results are displayed) Labs Reviewed  AEROBIC CULTURE (SUPERFICIAL SPECIMEN)    EKG None  Radiology No results found.  Procedures Procedures (including  critical care time)  Medications Ordered in UC Medications - No data to display  Initial Impression / Assessment and Plan / UC Course  I have reviewed the triage vital signs and the nursing notes.  Pertinent labs & imaging results that were available during my care of the patient were reviewed by me and considered in my medical decision making (see chart for details).      Final Clinical Impressions(s) / UC Diagnoses   Final diagnoses:  Eczema, unspecified type  Skin infection  Patient is well-appearing.  She is afebrile.  Suspect reactive lymphadenopathy and I have given her strict instructions if does not resolve in the next  couple weeks.  She understands return for further evaluation.  Concern for staph v MRSA infection bilateral auricles at ears.  Likely started with contact dermatitis from nickel.  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  Will cover with oral, and topical antibiotics.    I advised her to start topical corticosteroid for rash between the chest, left forearm.  Education provided on atopic dermatitis.  Return precautions given.   Discharge Instructions     As discussed, suspect discharge coming from bilateral earlobes is infectious.  I would like for to take an oral antibiotic.  also use a topical antibiotic for this.  Please monitor your lymph nodes as I suspect they are reactive due to the infection.  These should resolve in a couple weeks.  If they do not completely resolve, please return for further evaluation.  Please abstain from nickel as we discussed to suspect this is contributory to to your rashes.  May start the topical corticosteroid.  As advised, may cause discoloration of the skin, appearance of varicosities.  Use this in combination with an emollient such as Vaseline, coconut oil to create a paste in short bursts - avoid the face.  Abstain from long hot showers as will  worsen eczema. Please return to clinic for any new or worsening  symptoms.    ED Prescriptions    Medication Sig Dispense Auth. Provider   doxycycline (DORYX) 100 MG EC tablet Take 1 tablet (100 mg total) by mouth 2 (two) times daily. 10 tablet Allegra Grana, FNP   mupirocin ointment (BACTROBAN) 2 % Place 1 application into the nose 2 (two) times daily. 22 g Allegra Grana, FNP   triamcinolone (KENALOG) 0.025 % cream Apply 1 application topically 2 (two) times daily. 80 g Allegra Grana, FNP     Controlled Substance Prescriptions [place] Controlled Substance Registry consulted? Not Applicable   Allegra Grana, [place] 11/06/17 [phone]

## 2017-11-06 NOTE — Discharge Instructions (Signed)
As discussed, suspect discharge coming from bilateral earlobes is infectious.  I would like for to take an oral antibiotic.  also use a topical antibiotic for this.  Please monitor your lymph nodes as I suspect they are reactive due to the infection.  These should resolve in a couple weeks.  If they do not completely resolve, please return for further evaluation.  Please abstain from nickel as we discussed to suspect this is contributory to to your rashes.  May start the topical corticosteroid.  As advised, may cause discoloration of the skin, appearance of varicosities.  Use this in combination with an emollient such as Vaseline, coconut oil to create a paste in short bursts - avoid the face.  Abstain from long hot showers as will  worsen eczema. Please return to clinic for any new or worsening symptoms.

## 2017-11-08 ENCOUNTER — Telehealth (HOSPITAL_COMMUNITY): Payer: Self-pay

## 2017-11-08 NOTE — Telephone Encounter (Signed)
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 Pt concerned about going back to work. Dorene Grebeatalie FNP states if it is covered she should be okay to go to work however she should talk to her employer about this.  Pt verbalized understanding.

## 2017-11-09 LAB — AEROBIC CULTURE  (SUPERFICIAL SPECIMEN)

## 2017-11-09 LAB — AEROBIC CULTURE W GRAM STAIN (SUPERFICIAL SPECIMEN)

## 2018-04-17 ENCOUNTER — Emergency Department (HOSPITAL_COMMUNITY)
Admission: EM | Admit: 2018-04-17 | Discharge: 2018-04-18 | Disposition: A | Discharge status: Home / Self Care | Payer: Medicaid Other | Attending: Emergency Medicine | Admitting: Emergency Medicine

## 2018-04-17 ENCOUNTER — Other Ambulatory Visit: Payer: Self-pay

## 2018-04-17 DIAGNOSIS — R42 Dizziness and giddiness: Secondary | ICD-10-CM | POA: Insufficient documentation

## 2018-04-17 MED ORDER — SODIUM CHLORIDE 0.9 % IV BOLUS
1000.0000 mL | Freq: Once | INTRAVENOUS | Status: AC
  Administered 2018-04-17: 1000 mL via INTRAVENOUS

## 2018-04-17 MED ORDER — PROCHLORPERAZINE EDISYLATE 10 MG/2ML IJ SOLN
10.0000 mg | Freq: Once | INTRAMUSCULAR | Status: AC
  Administered 2018-04-17: 10 mg via INTRAVENOUS
  Filled 2018-04-17: qty 2

## 2018-04-17 MED ORDER — MECLIZINE HCL 25 MG PO TABS
25.0000 mg | ORAL_TABLET | Freq: Once | ORAL | Status: AC
  Administered 2018-04-17: 25 mg via ORAL
  Filled 2018-04-17: qty 1

## 2018-04-17 NOTE — ED Notes (Signed)
Bed: [id number] Expected date:  Expected time:  Means of arrival:  Comments: 42yo F/[place]

## 2018-04-17 NOTE — ED Notes (Signed)
Pt states dizziness improved following medication, nausea remains.  Preparing to administer ordered medication.

## 2018-04-17 NOTE — ED Provider Notes (Addendum)
[facility]EM[facility]GENCY [facility]PT Provider Note   CSN: [id number] Arrival date & time: 04/17/18  2059     History   Chief Complaint Chief Complaint  Patient presents with  . Emesis  . Nausea  . Dizziness    HPI Kelly Holloway is a 42 y.o. female brought in by EMS for evaluation of nausea, vomiting, dizziness that began just prior to ED arrival.  Patient reports that this evening, she ate pizza and a candy bar from a outside restaurant.  She reports that when she was at home, she felt some nausea and dizziness that she describes as "the room spinning around and feeling swimmy headed."  She reports one episode of vomiting.  She called EMS and had an additional episode of vomiting in the ambulance.  Patient reports that since being here in the ED, she feels slightly better but still feels dizzy.  She states she did not have any chest pain or abdominal pain at onset of symptoms.  She states that her boyfriend ate the pizza also and complained of some nausea.  She also reports that she looked at the candy bar before she left to the ambulance and noted that it had been expired.  Patient reports that she had been in her normal state of health prior to onset of symptoms.  Patient denies any vision changes, chest pain, difficulty breathing, abdominal pain, numbness/weakness of her arms or legs.  The history is provided by the patient.    Past Medical History:  Diagnosis Date  . Obesity     Patient Active Problem List   Diagnosis Date Noted  . TOBACCO USER 03/09/2009  . OBESITY NOS 03/21/2007  . 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 gallstones    . TONSILLECTOMY    . TUBAL LIGATION       OB History   None      Home Medications    Prior to Admission medications   Medication Sig Start Date End Date Taking? Authorizing Provider  doxycycline (DORYX) 100 MG EC tablet Take 1 tablet (100 mg total) by mouth 2 (two)  times daily. Patient not taking: Reported on 04/17/2018 11/06/17   Allegra Grana, FNP  meclizine (ANTIVERT) 25 MG tablet Take 1 tablet (25 mg total) by mouth 2 (two) times daily as needed for dizziness. 04/18/18   Roxy Horseman, PA-C  mupirocin ointment (BACTROBAN) 2 % Place 1 application into the nose 2 (two) times daily. Patient not taking: Reported on 04/17/2018 11/06/17   Allegra Grana, FNP  triamcinolone (KENALOG) 0.025 % cream Apply 1 application topically 2 (two) times daily. Patient not taking: Reported on 04/17/2018 11/06/17   Allegra Grana, FNP    Family History Family History  Problem Relation Age of Onset  . Cancer Mother   . Cancer Father     Social History Social History   Tobacco Use  . Smoking status: Never Smoker  . Smokeless tobacco: Never Used  Substance Use Topics  . Alcohol use: Yes    Comment: occasionally  . Drug use: No     Allergies   Patient has no known allergies.   Review of Systems Review of Systems  Eyes: Negative for visual disturbance.  Respiratory: Negative for cough and shortness of breath.   Cardiovascular: Negative for chest pain.  Gastrointestinal: Positive for nausea and vomiting. Negative for abdominal pain.  Genitourinary: Negative for dysuria.  Neurological: Positive for dizziness.  Negative for weakness, numbness and headaches.  All other systems reviewed and are negative.    Physical Exam Updated Vital Signs BP 117/61   Pulse 75   Temp 98.2 F (36.8 C) (Oral)   Resp 20   Ht 5\' 11"  (1.803 m)   LMP  (LMP Unknown)   SpO2 97%   BMI 44.92 kg/m   Physical Exam  Constitutional: She is oriented to person, place, and time. She appears well-developed and well-nourished.  HENT:  Head: Normocephalic and atraumatic.  Mouth/Throat: Oropharynx is clear and moist and mucous membranes are normal.  Eyes: Pupils are equal, round, and reactive to light. Conjunctivae and lids are normal. Right eye exhibits nystagmus. Left  eye exhibits nystagmus.  Slight horizontal nystagmus noted to the right.  No vertical or rotational nystagmus noted.  Neck: Full passive range of motion without pain.  Cardiovascular: Normal rate, regular rhythm, normal heart sounds and normal pulses. Exam reveals no gallop and no friction rub.  No murmur heard. Pulmonary/Chest: Effort normal and breath sounds normal.  Lungs clear to auscultation bilaterally.  Symmetric chest rise.  No wheezing, rales, rhonchi.  Abdominal: Soft. Normal appearance. There is no tenderness. There is no rigidity and no guarding.  Abdomen is soft, non-distended, non-tender. No rigidity, No guarding. No peritoneal signs.  Musculoskeletal: Normal range of motion.  Neurological: She is alert and oriented to person, place, and time.  Cranial nerves III-XII intact Follows commands, Moves all extremities  5/5 strength to BUE and BLE  Sensation intact throughout all major nerve distributions Normal finger to nose. No dysdiadochokinesia. No pronator drift. No slurred speech. No facial droop.   Skin: Skin is warm and dry. Capillary refill takes less than 2 seconds.  Psychiatric: She has a normal mood and affect. Her speech is normal.  Nursing note and vitals reviewed.    ED Treatments / Results  Labs (all labs ordered are listed, but only abnormal results are displayed) Labs Reviewed - No data to display  EKG None  EKG: normal EKG, normal sinus rhythm, Rate 61.   Radiology No results found.  Procedures Procedures (including critical care time)  Medications Ordered in ED Medications  sodium chloride 0.9 % bolus 1,000 mL (0 mLs Intravenous Stopped 04/18/18 0206)  meclizine (ANTIVERT) tablet 25 mg (25 mg Oral Given 04/17/18 2244)  prochlorperazine (COMPAZINE) injection 10 mg (10 mg Intravenous Given 04/17/18 2349)     Initial Impression / Assessment and Plan / ED Course  I have reviewed the triage vital signs and the nursing notes.  Pertinent labs  & imaging results that were available during my care of the patient were reviewed by me and considered in my medical decision making (see chart for details).     42 y.o. F resents for evaluation of nausea, vomiting and dizziness that began this evening.  Reports that she ate some pizza and candy which her boyfriend also ate and started feeling nauseous afterward.  He was feeling nauseous also.  She had 2 episodes of vomiting as well as dizziness that she describes as a room spinning sensation.  Patient reports that she had no chest pain, abdominal pain, difficulty breathing.  No vision changes, numbness/weakness. Patient is afebrile, non-toxic appearing, sitting comfortably on examination table. Vital signs reviewed and stable.  No neuro deficits on exam.  She does have some mild right-sided horizontal nystagmus.  No rotational, vertical nystagmus.  Suspect symptoms are likely related to either vertigo versus food poisoning.  History/physical exam is not  concerning for CVA, to cranial hemorrhage, ACS etiology.  Patient will be given fluids, meclizine.  She received Zofran in route.  Reevaluation.  Patient just received meclizine prior to my arrival.  She reports that dizziness is slightly improved. She has not had any more vomiting since being in the ED but still feels slightly like the room is spinning when we attempted a walker.  Will give additional Compazine and reassess.  Patient signed out to [employer] Browning, PA-C ending reevaluation after Compazine and fluids.  If patient can ambulate without any difficulty anticipate discharge.  Final Clinical Impressions(s) / ED Diagnoses   Final diagnoses:  Vertigo    ED Discharge Orders         Ordered    meclizine (ANTIVERT) 25 MG tablet  2 times daily PRN     04/18/18 0044           Maxwell CaulLayden, Lindsey A, PA-C 04/20/18 1427    Curatolo, Adam, DO 04/20/18 1504    Maxwell CaulLayden, Lindsey A, PA-C 05/01/18 1301    Virgina Norfolkuratolo, Adam, DO 05/01/18 1610

## 2018-04-17 NOTE — ED Notes (Signed)
 4mg  IM  zofran by EMS

## 2018-04-17 NOTE — ED Triage Notes (Signed)
Pt presents nausea, vomiting, dizziness.  Pt states she ate a pizza and candy bar and shortly thereafter started feeling nauseated, dizzy, and started vomiting.  Pt vomited 3x with EMS.  IM zofran 4 mg administered following unsuccessful IV attempt.  Pt stated to EMS no medial history medication use, no medical conditions.

## 2018-04-18 MED ORDER — MECLIZINE HCL 25 MG PO TABS: 25.0000 mg | ORAL_TABLET | Freq: Two times a day (BID) | ORAL | 0 refills | Status: DC | PRN

## 2018-04-18 NOTE — ED Provider Notes (Signed)
Patient signed out to me at shift change.  Patient had acute onset dizziness tonight.  Seems like the room is spinning.  Has had some improvement of her symptoms with meclizine and Compazine.  She ambulates maintaining stable steady gait, but states that she still feels little dizzy.  We will discharged home with meclizine.  Doubt CVA.   Roxy HorsemanBrowning, Merlie Noga, PA-C 04/18/18 0046    Virgina Norfolkuratolo, Adam, DO 04/18/18 [id number]

## 2020-12-27 ENCOUNTER — Ambulatory Visit (HOSPITAL_COMMUNITY)
Admission: EM | Admit: 2020-12-27 | Discharge: 2020-12-27 | Disposition: A | Discharge status: Home / Self Care | Payer: Medicaid Other | Attending: Student | Admitting: Student

## 2020-12-27 ENCOUNTER — Other Ambulatory Visit: Payer: Self-pay

## 2020-12-27 ENCOUNTER — Encounter (HOSPITAL_COMMUNITY): Payer: Self-pay

## 2020-12-27 DIAGNOSIS — M170 Bilateral primary osteoarthritis of knee: Secondary | ICD-10-CM

## 2020-12-27 DIAGNOSIS — S39012A Strain of muscle, fascia and tendon of lower back, initial encounter: Secondary | ICD-10-CM

## 2020-12-27 LAB — POCT URINALYSIS DIPSTICK, ED / UC
Bilirubin Urine: NEGATIVE
Glucose, UA: NEGATIVE mg/dL
Ketones, ur: NEGATIVE mg/dL
Leukocytes,Ua: NEGATIVE
Nitrite: NEGATIVE
Protein, ur: NEGATIVE mg/dL
Specific Gravity, Urine: 1.02 (ref 1.005–1.030)
Urobilinogen, UA: 0.2 mg/dL (ref 0.0–1.0)
pH: 6.5 (ref 5.0–8.0)

## 2020-12-27 LAB — POC URINE PREG, ED: Preg Test, Ur: NEGATIVE

## 2020-12-27 MED ORDER — TIZANIDINE HCL 2 MG PO TABS: 2.0000 mg | ORAL_TABLET | Freq: Four times a day (QID) | ORAL | 0 refills | Status: DC | PRN

## 2020-12-27 NOTE — Discharge Instructions (Addendum)
-  Start the muscle relaxer-Zanaflex (tizanidine), up to 3 times daily for muscle spasms and pain.  This can make you drowsy, so take at bedtime or when you do not need to drive or operate machinery. -You can continue naproxen or ibuprofen OTC. Also tylenol for additional relief. Rest, ice/heat.  -Come back and see [place] if new symptoms like urinary symptoms

## 2020-12-27 NOTE — ED Triage Notes (Signed)
Pt presents with back pain and bilateral knee pain X 1 week that is non injury related.

## 2020-12-27 NOTE — ED Provider Notes (Signed)
[facility]    CSN: [id number] Arrival date & time: 12/27/20  1813      History   Chief Complaint Chief Complaint  Patient presents with   Back Pain   Knee Pain    HPI Kelly Holloway is a 45 y.o. female presenting for back and knee pain following a long car ride.  Medical history obesity, tobacco use.  States that she went on a long car ride about 5 days ago, following this developed bilateral knee pain and swelling in the ankles, this has largely improved but she is still having some bilateral knee pain.  Also with bilateral lumbar paraspinous muscle tenderness, worse with ambulation and flexion lumbar spine.  She does endorse some urinary frequency and odor. Naproxen providing minimal relief. Denies pain shooting down legs, denies numbness in arms/legs, denies weakness in arms/legs, denies saddle anesthesia, denies bowel/bladder incontinence, denies urinary retention, denies constipation. Denies hematuria, dysuria, urgency, back pain, n/v/d/abd pain, fevers/chills, abdnormal vaginal discharge. Denies changes in bowel/bladder function.   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      Home Medications    Prior to Admission medications   Medication Sig Start Date End Date Taking? Authorizing Provider  tiZANidine (ZANAFLEX) 2 MG tablet Take 1 tablet (2 mg total) by mouth every 6 (six) hours as needed for muscle spasms. 12/27/20  Yes Rhys Martini, PA-C  doxycycline (DORYX) 100 MG EC tablet Take 1 tablet (100 mg total) by mouth 2 (two) times daily. Patient not taking: Reported on 04/17/2018 11/06/17   Allegra Grana, FNP  meclizine (ANTIVERT) 25 MG tablet  Take 1 tablet (25 mg total) by mouth 2 (two) times daily as needed for dizziness. 04/18/18   Roxy Horseman, PA-C  mupirocin ointment (BACTROBAN) 2 % Place 1 application into the nose 2 (two) times daily. Patient not taking: Reported on 04/17/2018 11/06/17   Allegra Grana, FNP  triamcinolone (KENALOG) 0.025 % cream Apply 1 application topically 2 (two) times daily. Patient not taking: Reported on 04/17/2018 11/06/17   Allegra Grana, FNP    Family History Family History  Problem Relation Age of Onset   Cancer Mother    Cancer Father     Social History Social History   Tobacco Use   Smoking status: Never   Smokeless tobacco: Never  Vaping Use   Vaping Use: Never used  Substance Use Topics   Alcohol use: Yes    Comment: occasionally   Drug use: No     Allergies   Patient has no known allergies.   Review of Systems Review of Systems  Constitutional:  Negative for chills, fever and unexpected weight change.  Respiratory:  Negative for chest tightness and shortness of breath.   Cardiovascular:  Negative for chest pain and palpitations.  Gastrointestinal:  Negative for abdominal pain, diarrhea, nausea and vomiting.  Genitourinary:  Positive for frequency. Negative for decreased urine volume and difficulty urinating.  Musculoskeletal:  Positive for back pain. Negative for arthralgias, gait problem, joint swelling, myalgias, neck pain and neck stiffness.  Bilateral knee pain  Skin:  Negative for wound.  Neurological:  Negative for dizziness, tremors, seizures, syncope, facial asymmetry, speech difficulty, weakness, light-headedness, numbness and headaches.  All other systems reviewed and are negative.   Physical Exam Triage Vital Signs ED Triage Vitals  Enc Vitals Group     BP 12/27/20 1842 (!) 141/83     Pulse Rate 12/27/20 1842 73     Resp 12/27/20 1842 18     Temp 12/27/20 1842 98.6 F (37 C)     Temp Source 12/27/20 1842 Oral     SpO2 12/27/20 1842 97  %     Weight --      Height --      Head Circumference --      Peak Flow --      Pain Score 12/27/20 1843 9     Pain Loc --      Pain Edu? --      Excl. in GC? --    No data found.  Updated Vital Signs BP (!) 141/83 (BP Location: Left Arm)   Pulse 73   Temp 98.6 F (37 C) (Oral)   Resp 18   LMP  (LMP Unknown)   SpO2 97%   Visual Acuity Right Eye Distance:   Left Eye Distance:   Bilateral Distance:    Right Eye Near:   Left Eye Near:    Bilateral Near:     Physical Exam Vitals reviewed.  Constitutional:      General: She is not in acute distress.    Appearance: Normal appearance. She is obese. She is not ill-appearing.  HENT:     Head: Normocephalic and atraumatic.     Mouth/Throat:     Mouth: Mucous membranes are moist.     Comments: Moist mucous membranes Eyes:     Extraocular Movements: Extraocular movements intact.     Pupils: Pupils are equal, round, and reactive to light.  Cardiovascular:     Rate and Rhythm: Normal rate and regular rhythm.     Heart sounds: Normal heart sounds.  Pulmonary:     Effort: Pulmonary effort is normal.     Breath sounds: Normal breath sounds and air entry. No wheezing, rhonchi or rales.  Abdominal:     General: Bowel sounds are normal. There is no distension.     Palpations: Abdomen is soft. There is no mass.     Tenderness: There is no abdominal tenderness. There is no right CVA tenderness, left CVA tenderness, guarding or rebound.  Musculoskeletal:     Cervical back: Normal range of motion. No swelling, deformity, signs of trauma, rigidity, spasms, tenderness, bony tenderness or crepitus. No pain with movement.     Thoracic back: No swelling, deformity, signs of trauma, spasms, tenderness or bony tenderness. Normal range of motion. No scoliosis.     Lumbar back: No swelling, deformity, signs of trauma, spasms, tenderness or bony tenderness. Normal range of motion. Negative right straight leg raise test and negative left  straight leg raise test. No scoliosis.     Comments: Bilateral lumbar paraspinous muscle tenderness to palpation, pain elicited with flexion lumbar spine.  Negative straight leg raise bilaterally.  Gait intact but with pain.  Sensation and strength intact upper and lower extremities, no saddle anesthesia. No midline spinous tenderness, deformity, stepoff. Bilateral knees with minimal effusion, no reproducible tenderness or crepitus. No joint laxity. Calves are symmetric and nontender. No pedal edema.  Absolutely no other injury, deformity, tenderness, ecchymosis, abrasion.  Skin:    General: Skin is warm.     Capillary Refill: Capillary refill takes less than 2 seconds.     Comments: Good skin turgor  Neurological:     General: No focal deficit present.     Mental Status: She is alert and oriented to person, place, and time.     Cranial Nerves: No cranial nerve deficit.  Psychiatric:        Mood and Affect: Mood normal.        Behavior: Behavior normal.        Thought Content: Thought content normal.        Judgment: Judgment normal.     UC Treatments / Results  Labs (all labs ordered are listed, but only abnormal results are displayed) Labs Reviewed  POCT URINALYSIS DIPSTICK, ED / UC - Abnormal; Notable for the following components:      Result Value   Hgb urine dipstick TRACE (*)    All other components within normal limits  POC URINE PREG, ED    EKG   Radiology No results found.  Procedures Procedures (including critical care time)  Medications Ordered in UC Medications - No data to display  Initial Impression / Assessment and Plan / UC Course  I have reviewed the triage vital signs and the nursing notes.  Pertinent labs & imaging results that were available during my care of the patient were reviewed by me and considered in my medical decision making (see chart for details).     This patient is a very pleasant 45 y.o. year old female presenting with knee pain  (suspect OA) and lumbar strain. No red flag symptoms.  Afebrile, nontachycardic, no abdominal pain or flank pain.  Urine pregnancy negative. UA with trace blood. Given lack of urinary symptoms did not send culture.  Zanaflex, continue OTC nsaids.   ED return precautions discussed. Patient verbalizes understanding and agreement.   Final Clinical Impressions(s) / UC Diagnoses   Final diagnoses:  Lumbar strain, initial encounter  Primary osteoarthritis of both knees     Discharge Instructions      -Start the muscle relaxer-Zanaflex (tizanidine), up to 3 times daily for muscle spasms and pain.  This can make you drowsy, so take at bedtime or when you do not need to drive or operate machinery. -You can continue naproxen or ibuprofen OTC. Also tylenol for additional relief. Rest, ice/heat.  -Come back and see [place] if new symptoms like urinary symptoms     ED Prescriptions     Medication Sig Dispense Auth. Provider   tiZANidine (ZANAFLEX) 2 MG tablet Take 1 tablet (2 mg total) by mouth every 6 (six) hours as needed for muscle spasms. 21 tablet Rhys Martini, PA-C      PDMP not reviewed this encounter.   Rhys Martini, PA-C 12/27/20 1946

## 2021-08-24 ENCOUNTER — Other Ambulatory Visit: Payer: Self-pay

## 2021-08-24 ENCOUNTER — Encounter (HOSPITAL_COMMUNITY): Payer: Self-pay

## 2021-08-24 ENCOUNTER — Ambulatory Visit (HOSPITAL_COMMUNITY)
Admission: EM | Admit: 2021-08-24 | Discharge: 2021-08-24 | Disposition: A | Discharge status: Home / Self Care | Payer: Medicaid Other | Attending: Family Medicine | Admitting: Family Medicine

## 2021-08-24 DIAGNOSIS — M5442 Lumbago with sciatica, left side: Secondary | ICD-10-CM

## 2021-08-24 MED ORDER — TIZANIDINE HCL 4 MG PO TABS: 4.0000 mg | ORAL_TABLET | Freq: Three times a day (TID) | ORAL | 0 refills | Status: DC | PRN

## 2021-08-24 MED ORDER — IBUPROFEN 800 MG PO TABS: 800.0000 mg | ORAL_TABLET | Freq: Three times a day (TID) | ORAL | 0 refills | Status: DC | PRN

## 2021-08-24 MED ORDER — KETOROLAC TROMETHAMINE 30 MG/ML IJ SOLN
INTRAMUSCULAR | Status: AC
  Filled 2021-08-24: qty 1

## 2021-08-24 MED ORDER — KETOROLAC TROMETHAMINE 30 MG/ML IJ SOLN
30.0000 mg | Freq: Once | INTRAMUSCULAR | Status: AC
  Administered 2021-08-24: 30 mg via INTRAMUSCULAR

## 2021-08-24 NOTE — Discharge Instructions (Addendum)
You have been given a shot of Toradol 30 mg today in the office ? ?Ibuprofen 800 mg--1 tab every 8 hours as needed for pain ? ?Tizanidine 4 mg--1 tablet every 8 hours as needed for muscle spasm or muscle pain.  This medication can make you sleepy or dizzy ? ?You can use heat or ice wherever you are hurting. ?

## 2021-08-24 NOTE — ED Triage Notes (Signed)
Pt presents with c/o sciatic nerve pain, back pain and leg pain.  ? ?States she has taken OTC Naproxen, states it has not helped.  ?

## 2021-08-24 NOTE — ED Provider Notes (Signed)
?[facility] ? ? ? ?CSN: [id number] ?Arrival date & time: 08/24/21  1133 ? ? ?  ? ?History   ?Chief Complaint ?Chief Complaint  ?Patient presents with  ? Back Pain  ? ? ?HPI ?Kelly Holloway is a 46 y.o. female.  ? ? ?Back Pain ?Here for a 5-day history of pain in her mid low back that is radiating to her left lower leg.  No hip history of recent trauma.  She has had problems off and on in the past with her low back and sciatica.  Usually though she is able to improve within a few days using over-the-counter Aleve.  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 ?Past Medical History:  ?Diagnosis Date  ? Obesity   ? ? ?Patient Active Problem List  ? Diagnosis Date Noted  ? TOBACCO USER 03/09/2009  ? OBESITY NOS 03/21/2007  ? 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 ? ?Past Surgical History:  ?Procedure Laterality Date  ? gallstones    ? TONSILLECTOMY    ? TUBAL LIGATION    ? ? ?OB History   ?No obstetric history on file. ?  ? ? ? ?Home Medications   ? ?Prior to Admission medications   ?Medication Sig Start Date End Date Taking? Authorizing Provider  ?ibuprofen (ADVIL) 800 MG tablet Take 1 tablet (800 mg total) by mouth every 8 (eight) hours as needed (pain). 08/24/21  Yes Zenia Resides, MD  ?tiZANidine (ZANAFLEX) 4 MG tablet Take 1 tablet (4 mg total) by mouth every 8 (eight) hours as needed for muscle spasms. 08/24/21  Yes Zenia Resides, MD  ? ? ?Family History ?Family History  ?Problem Relation Age of Onset  ? Cancer Mother   ? Cancer Father   ? ? ?Social History ?Social History  ? ?Tobacco Use  ? Smoking status: Every Day  ?  Types: Cigarettes  ? Smokeless tobacco: Never  ?Vaping Use  ? Vaping Use: Never used  ?Substance Use Topics  ? Alcohol use: Yes  ?  Comment: occasionally  ? Drug use: No  ? ? ? ?Allergies   ?Patient has no known allergies. ? ? ?Review of Systems ?Review of Systems  ?Musculoskeletal:   Positive for back pain.  ? ? ?Physical Exam ?Triage Vital Signs ?ED Triage Vitals  ?Enc Vitals Group  ?   BP 08/24/21 1357 (!) 144/109  ?   Pulse Rate 08/24/21 1357 63  ?   Resp 08/24/21 1357 17  ?   Temp 08/24/21 1357 98.3 ?F (36.8 ?C)  ?   Temp Source 08/24/21 1357 Oral  ?   SpO2 08/24/21 1357 100 %  ?   Weight --   ?   Height --   ?   Head Circumference --   ?   Peak Flow --   ?   Pain Score 08/24/21 1356 10  ?   Pain Loc --   ?   Pain Edu? --   ?   Excl. in GC? --   ? ?No data found. ? ?Updated Vital Signs ?BP (!) 144/109 (BP Location: Right Arm)   Pulse 63   Temp 98.3 ?F (36.8 ?C) (Oral)   Resp 17   LMP 07/30/2021 (Exact Date)   SpO2 100%  ? ?Visual Acuity ?Right Eye Distance:   ?Left Eye Distance:   ?Bilateral Distance:   ? ?Right  Eye Near:   ?Left Eye Near:    ?Bilateral Near:    ? ?Physical Exam ?Vitals reviewed.  ?Constitutional:   ?   General: She is not in acute distress. ?   Appearance: She is not toxic-appearing.  ?Eyes:  ?   Extraocular Movements: Extraocular movements intact.  ?   Pupils: Pupils are equal, round, and reactive to light.  ?Cardiovascular:  ?   Rate and Rhythm: Normal rate and regular rhythm.  ?   Heart sounds: No murmur heard. ?Pulmonary:  ?   Effort: Pulmonary effort is normal.  ?   Breath sounds: Normal breath sounds.  ?Musculoskeletal:     ?   General: Tenderness (Left low back) present.  ?   Right lower leg: No edema.  ?   Left lower leg: No edema.  ?Skin: ?   Coloration: Skin is not jaundiced or pale.  ?Neurological:  ?   Mental Status: She is oriented to person, place, and time.  ?Psychiatric:     ?   Behavior: Behavior normal.  ? ? ? ?UC Treatments / Results  ?Labs ?(all labs ordered are listed, but only abnormal results are displayed) ?Labs Reviewed - No data to display ? ?EKG ? ? ?Radiology ?No results found. ? ?Procedures ?Procedures (including critical care time) ? ?Medications Ordered in UC ?Medications  ?ketorolac (TORADOL) 30 MG/ML injection 30 mg (has no  administration in time range)  ? ? ?Initial Impression / Assessment and Plan / UC Course  ?I have reviewed the triage vital signs and the nursing notes. ? ?Pertinent labs & imaging results that were available during my care of the patient were reviewed by me and considered in my medical decision making (see chart for details). ? ?  ? ?We will treat for for pain and muscle spasm.  She will call her PCP.  Also we will give her some rest from work ?Final Clinical Impressions(s) / UC Diagnoses  ? ?Final diagnoses:  ?Acute left-sided low back pain with left-sided sciatica  ? ? ? ?Discharge Instructions   ? ?  ?You have been given a shot of Toradol 30 mg today in the office ? ?Ibuprofen 800 mg--1 tab every 8 hours as needed for pain ? ?Tizanidine 4 mg--1 tablet every 8 hours as needed for muscle spasm or muscle pain.  This medication can make you sleepy or dizzy ? ?You can use heat or ice wherever you are hurting. ? ? ? ? ?ED Prescriptions   ? ? Medication Sig Dispense Auth. Provider  ? ibuprofen (ADVIL) 800 MG tablet Take 1 tablet (800 mg total) by mouth every 8 (eight) hours as needed (pain). 21 tablet Cydnee Fuquay, Janace Aris, MD  ? tiZANidine (ZANAFLEX) 4 MG tablet Take 1 tablet (4 mg total) by mouth every 8 (eight) hours as needed for muscle spasms. 30 tablet Zenia Resides, MD  ? ?  ? ?I have reviewed the PDMP during this encounter. ?  ?Zenia Resides, MD ?08/24/21 1412 ? ?

## 2021-08-29 ENCOUNTER — Encounter (HOSPITAL_COMMUNITY): Payer: Self-pay

## 2021-08-29 ENCOUNTER — Ambulatory Visit (HOSPITAL_COMMUNITY)
Admission: EM | Admit: 2021-08-29 | Discharge: 2021-08-29 | Disposition: A | Discharge status: Home / Self Care | Payer: Self-pay | Attending: Family Medicine | Admitting: Family Medicine

## 2021-08-29 ENCOUNTER — Ambulatory Visit (INDEPENDENT_AMBULATORY_CARE_PROVIDER_SITE_OTHER): Payer: Self-pay

## 2021-08-29 DIAGNOSIS — R109 Unspecified abdominal pain: Secondary | ICD-10-CM | POA: Insufficient documentation

## 2021-08-29 DIAGNOSIS — G8929 Other chronic pain: Secondary | ICD-10-CM | POA: Insufficient documentation

## 2021-08-29 DIAGNOSIS — M545 Low back pain, unspecified: Secondary | ICD-10-CM

## 2021-08-29 LAB — POCT URINALYSIS DIPSTICK, ED / UC
Bilirubin Urine: NEGATIVE
Glucose, UA: NEGATIVE mg/dL
Ketones, ur: NEGATIVE mg/dL
Leukocytes,Ua: NEGATIVE
Nitrite: POSITIVE — AB
Protein, ur: NEGATIVE mg/dL
Specific Gravity, Urine: 1.015 (ref 1.005–1.030)
Urobilinogen, UA: 0.2 mg/dL (ref 0.0–1.0)
pH: 5 (ref 5.0–8.0)

## 2021-08-29 MED ORDER — METHYLPREDNISOLONE 4 MG PO TBPK: ORAL_TABLET | ORAL | 0 refills | Status: DC

## 2021-08-29 NOTE — Discharge Instructions (Addendum)
You were seen today for low back pain and abdominal pain.  ?Your urine does not show overt signs of infection.  We will send this for culture and if positive will call and start an antibiotic.  ?You lumbar xray did show disc space narrowing, but could not tell much more.  You need to see a primary care provider for further discussion and care.  Please go to [email or web address] to find one.  ?I have sent out a steroid pack to help with pain.  You may continue tylenol or motrin for pain.  Use a heating pad/ice pack as well.  ? ?

## 2021-08-29 NOTE — ED Provider Notes (Signed)
?[facility] ? ? ? ?CSN: [id number] ?Arrival date & time: 08/29/21  1111 ? ? ?  ? ?History   ?Chief Complaint ?Chief Complaint  ?Patient presents with  ? Back Pain  ? ? ?HPI ?Kelly Holloway is a 46 y.o. female.  ? ?Patient is here for continued back pain.  ?Pain started over a week ago.  Was seen here on Sunday, and thought to be sciatica related and sent home with muscle relaxer.  ?She continues to have pain, so does not think this is her sciatica.  ?Right now she is having pain in the lower abdomen, into her groin area, upper thighs, and into her low back.  The low back is the worse.  Pain is in the center of her back.  ?No urinary frequency per se.  No dysuria.  She did have to go to the bathroom before bed, in the middle of the night, and this morning, which is odd for her.  However, she is trying to increase her water intake/cranberry juice.  ?She is currently on her period.  ?She wants to check a urine and get a lumbar xray today ? ?Past Medical History:  ?Diagnosis Date  ? Obesity   ? ? ?Patient Active Problem List  ? Diagnosis Date Noted  ? TOBACCO USER 03/09/2009  ? OBESITY NOS 03/21/2007  ? 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 ? ?Past Surgical History:  ?Procedure Laterality Date  ? gallstones    ? TONSILLECTOMY    ? TUBAL LIGATION    ? ? ?OB History   ?No obstetric history on file. ?  ? ? ? ?Home Medications   ? ?Prior to Admission medications   ?Medication Sig Start Date End Date Taking? Authorizing Provider  ?ibuprofen (ADVIL) 800 MG tablet Take 1 tablet (800 mg total) by mouth every 8 (eight) hours as needed (pain). 08/24/21   Zenia ResidesBanister, Pamela K, MD  ?tiZANidine (ZANAFLEX) 4 MG tablet Take 1 tablet (4 mg total) by mouth every 8 (eight) hours as needed for muscle spasms. 08/24/21   Zenia ResidesBanister, Pamela K, MD  ? ? ?Family History ?Family History  ?Problem Relation Age of Onset  ? Cancer Mother   ? Cancer Father   ? ? ?Social History ?Social History  ? ?Tobacco Use  ? Smoking status: Every Day  ?  Types:  Cigarettes  ? Smokeless tobacco: Never  ?Vaping Use  ? Vaping Use: Never used  ?Substance Use Topics  ? Alcohol use: Yes  ?  Comment: occasionally  ? Drug use: No  ? ? ? ?Allergies   ?Patient has no known allergies. ? ? ?Review of Systems ?Review of Systems  ?Constitutional: Negative.   ?HENT: Negative.    ?Respiratory: Negative.    ?Cardiovascular: Negative.   ?Gastrointestinal:  Positive for abdominal pain. Negative for constipation, diarrhea and nausea.  ?Genitourinary:  Positive for dysuria. Negative for pelvic pain and urgency.  ?Musculoskeletal:  Positive for back pain.  ? ? ?Physical Exam ?Triage Vital Signs ?ED Triage Vitals [08/29/21 1151]  ?Enc Vitals Group  ?   BP 139/86  ?   Pulse Rate 78  ?   Resp 16  ?   Temp 98.1 ?F (36.7 ?C)  ?   Temp Source Oral  ?   SpO2 100 %  ?   Weight   ?   Height   ?   Head Circumference   ?   Peak Flow   ?   Pain Score 7  ?  Pain Loc   ?   Pain Edu?   ?   Excl. in GC?   ? ?No data found. ? ?Updated Vital Signs ?BP 139/86 (BP Location: Left Arm)   Pulse 78   Temp 98.1 ?F (36.7 ?C) (Oral)   Resp 16   LMP 07/30/2021 (Exact Date)   SpO2 100%  ? ?Visual Acuity ?Right Eye Distance:   ?Left Eye Distance:   ?Bilateral Distance:   ? ?Right Eye Near:   ?Left Eye Near:    ?Bilateral Near:    ? ?Physical Exam ?Constitutional:   ?   Appearance: Normal appearance.  ?Cardiovascular:  ?   Rate and Rhythm: Normal rate and regular rhythm.  ?Pulmonary:  ?   Effort: Pulmonary effort is normal.  ?   Breath sounds: Normal breath sounds.  ?Abdominal:  ?   Palpations: Abdomen is soft.  ?   Tenderness: There is abdominal tenderness in the suprapubic area.  ?   Comments: TTP along the low abdomen, across into the hips bilaterally;  no guarding or rebound  ?Musculoskeletal:  ?   Cervical back: Normal range of motion and neck supple.  ?   Comments: TTP at the lumbar spine;  slight paraspinal tenderness  ?Neurological:  ?   Mental Status: She is alert.  ? ? ? ?UC Treatments / Results  ?Labs ?(all  labs ordered are listed, but only abnormal results are displayed) ?Labs Reviewed  ?POCT URINALYSIS DIPSTICK, ED / UC - Abnormal; Notable for the following components:  ?    Result Value  ? Hgb urine dipstick MODERATE (*)   ? Nitrite POSITIVE (*)   ? All other components within normal limits  ? ? ?EKG ? ? ?Radiology ?DG Lumbar Spine Complete ? ?Result Date: 08/29/2021 ?CLINICAL DATA:  Low back pain for 4 days, no history of injury. EXAM: LUMBAR SPINE - COMPLETE 4+ VIEW COMPARISON:  May 16, 2012. FINDINGS: Study limited by body habitus. Five lumbar type vertebral bodies. 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 Alignment is normal. There is mild disc space narrowing throughout the lumbar spine. Signs of facet arthropathy at L3-4, L4-5 and L5-S1. IMPRESSION: Negative for acute fracture or static subluxation, moderately limited due to patient body habitus. 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 Electronically Signed   By: Donzetta Kohut M.D.   On: 08/29/2021 12:29   ? ?Procedures ?Procedures (including critical care time) ? ?Medications Ordered in UC ?Medications - No data to display ? ?Initial Impression / Assessment and Plan / UC Course  ?I have reviewed the triage vital signs and the nursing notes. ? ?Pertinent labs & imaging results that were available during my care of the patient were reviewed by me and considered in my medical decision making (see chart for details). ? ?Patient seen for continued low back pain, as well as low abdominal pain.  Her urine does not show overt infection, but will culture and treat if needed.  ?I think her pain is likely from her back.  I have sent out a steroid pack today, and encouraged her to see a PCP for possible MRI.  ?She is aware.  ?  ?Final Clinical Impressions(s) / UC Diagnoses  ? ?Final diagnoses:  ?Chronic midline low back pain without sciatica  ?Abdominal pain, unspecified abdominal location  ? ? ? ?Discharge Instructions   ? ?  ?You were seen today for low  back pain and abdominal pain.  ?Your urine does not show overt  signs of infection.  We will send this for culture and if positive will call and start an antibiotic.  ?You lumbar xray did show disc space narrowing, but could not tell much more.  You need to see a primary care provider for further discussion and care.  Please go to [email or web address] to find one.  ?I have sent out a steroid pack to help with pain.  You may continue tylenol or motrin for pain.  Use a heating pad/ice pack as well.  ? ? ? ? ?ED Prescriptions   ? ? Medication Sig Dispense Auth. Provider  ? methylPREDNISolone (MEDROL DOSEPAK) 4 MG TBPK tablet Take as directed 1 each Jannifer Franklin, MD  ? ?  ? ?PDMP not reviewed this encounter. ?  ?Jannifer Franklin, MD ?08/29/21 1250 ? ?

## 2021-08-29 NOTE — ED Triage Notes (Signed)
Pt presents with c/o lower back pain. She was seen on 08/24/2021 and states symptoms are not any better. She is taking medication as prescribe. She would like an x-ray and urine check today. ?

## 2021-08-31 LAB — URINE CULTURE: Culture: 20000 — AB

## 2021-09-01 ENCOUNTER — Telehealth (HOSPITAL_COMMUNITY): Payer: Self-pay | Admitting: Emergency Medicine

## 2021-09-01 MED ORDER — SULFAMETHOXAZOLE-TRIMETHOPRIM 800-160 MG PO TABS: 1.0000 | ORAL_TABLET | Freq: Two times a day (BID) | ORAL | 0 refills | Status: AC

## 2022-12-02 IMAGING — DX DG LUMBAR SPINE COMPLETE 4+V
4 series · 4 of 4 positions shown · non-contrast
Comparison: May 16, 2012.

CLINICAL DATA: Low back pain for 4 days, no history of injury.

EXAM:
LUMBAR SPINE - COMPLETE 4+ VIEW

[l-spine ap]
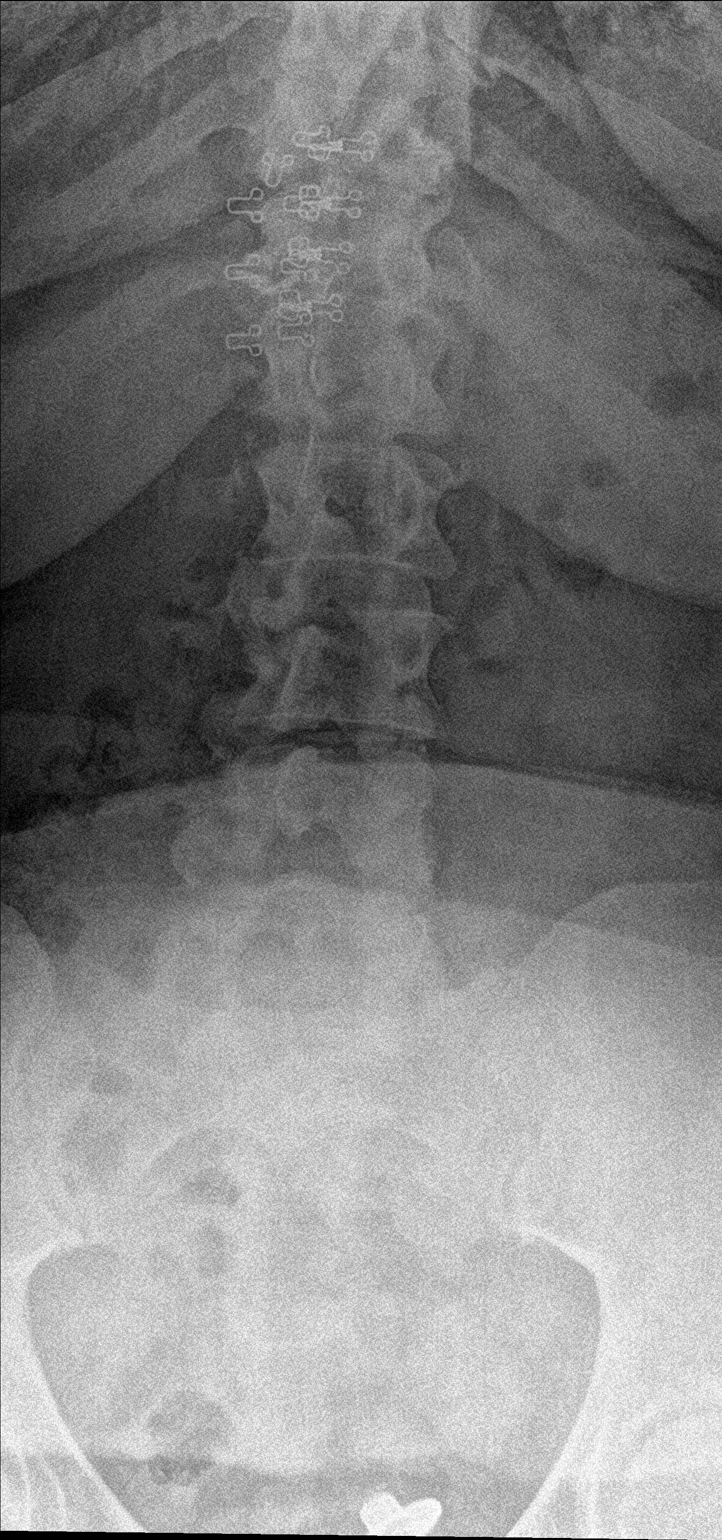

[l-spine obl (1 of 2)]
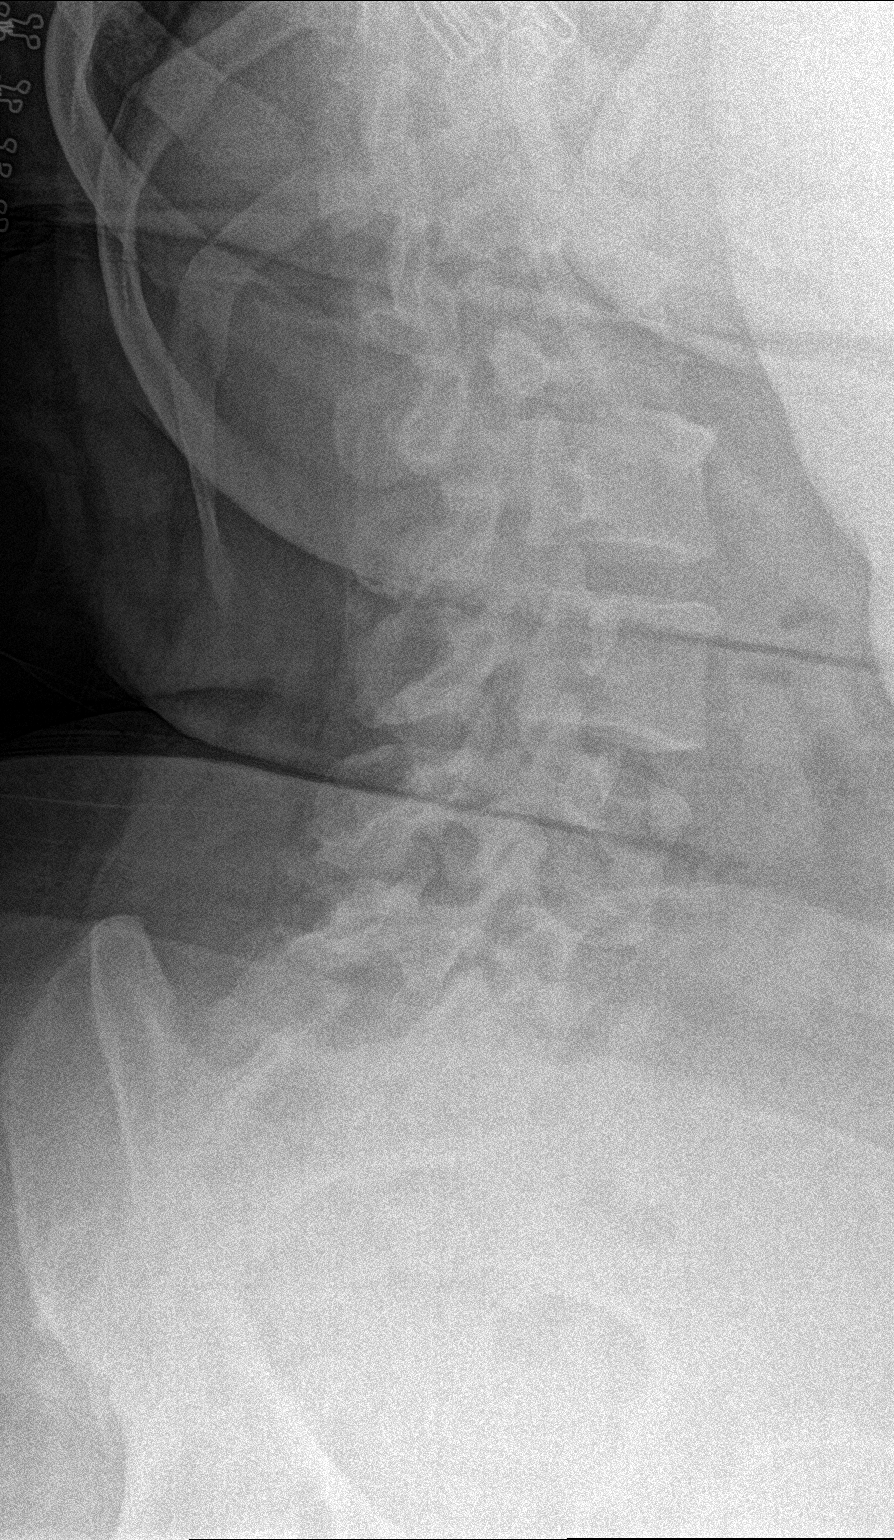

[l-spine obl (2 of 2)]
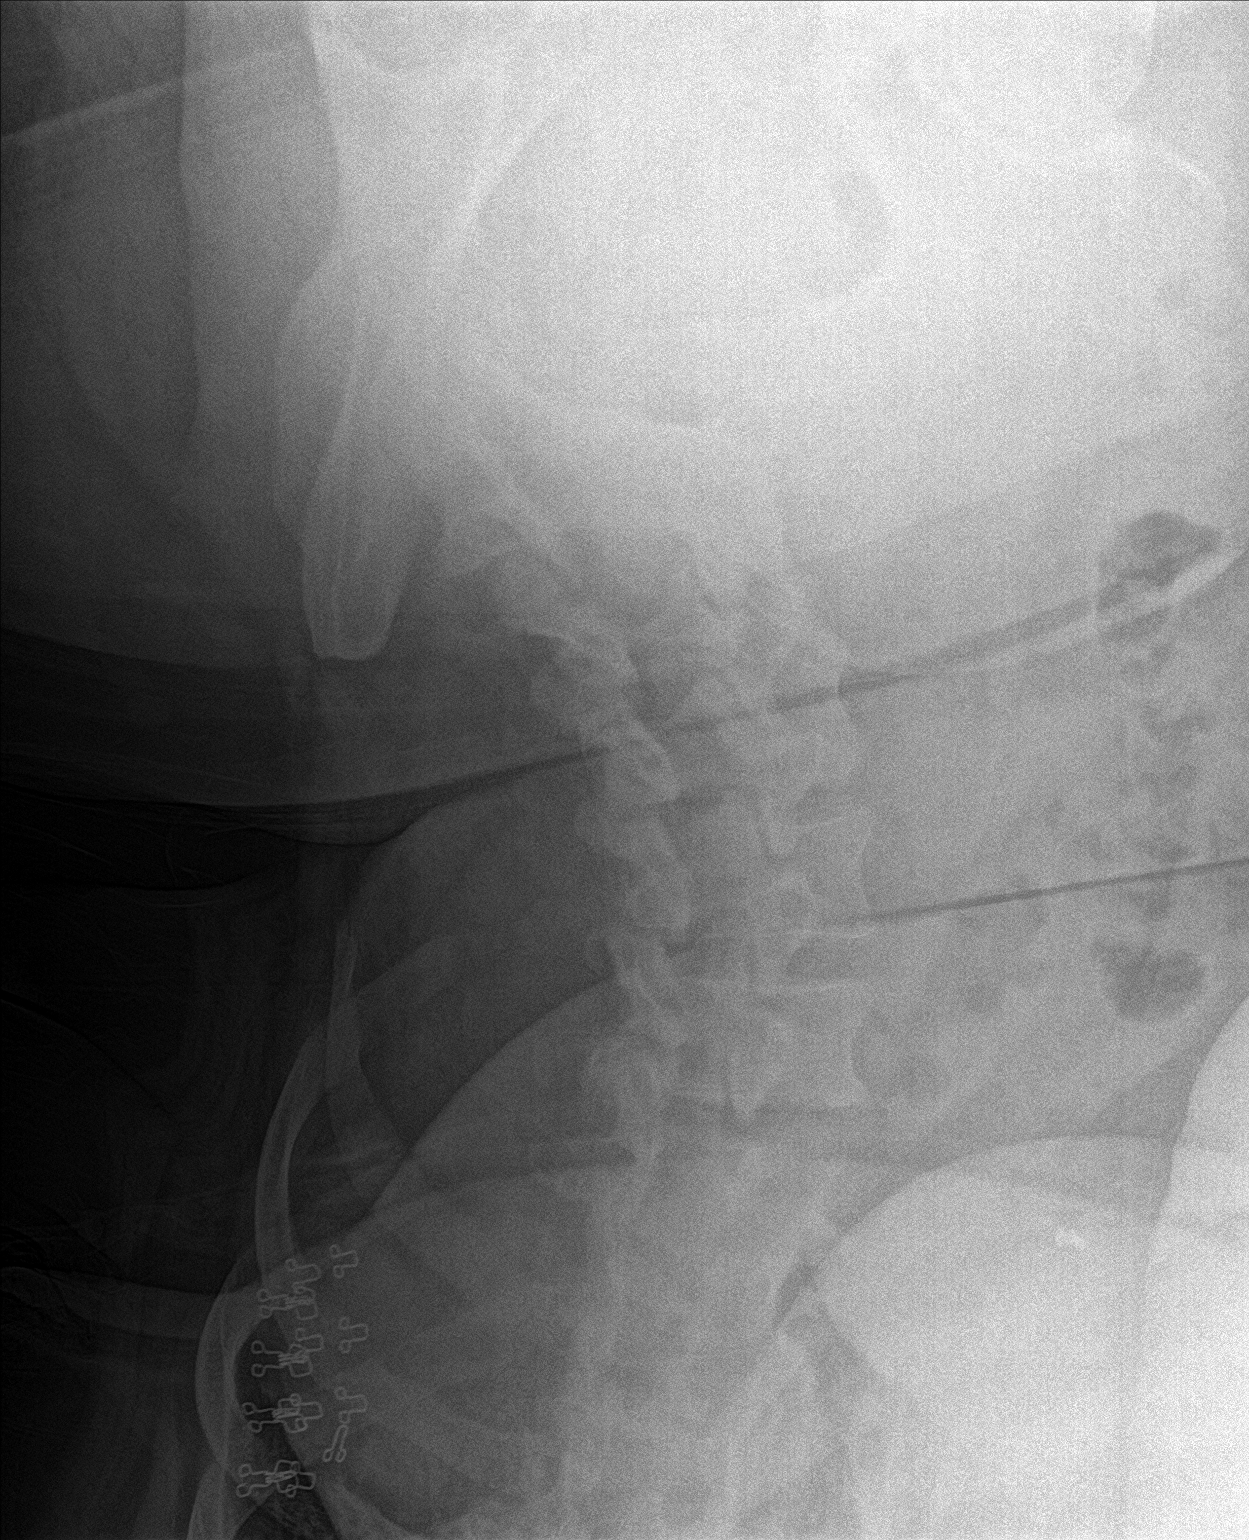

[l-spine lat]
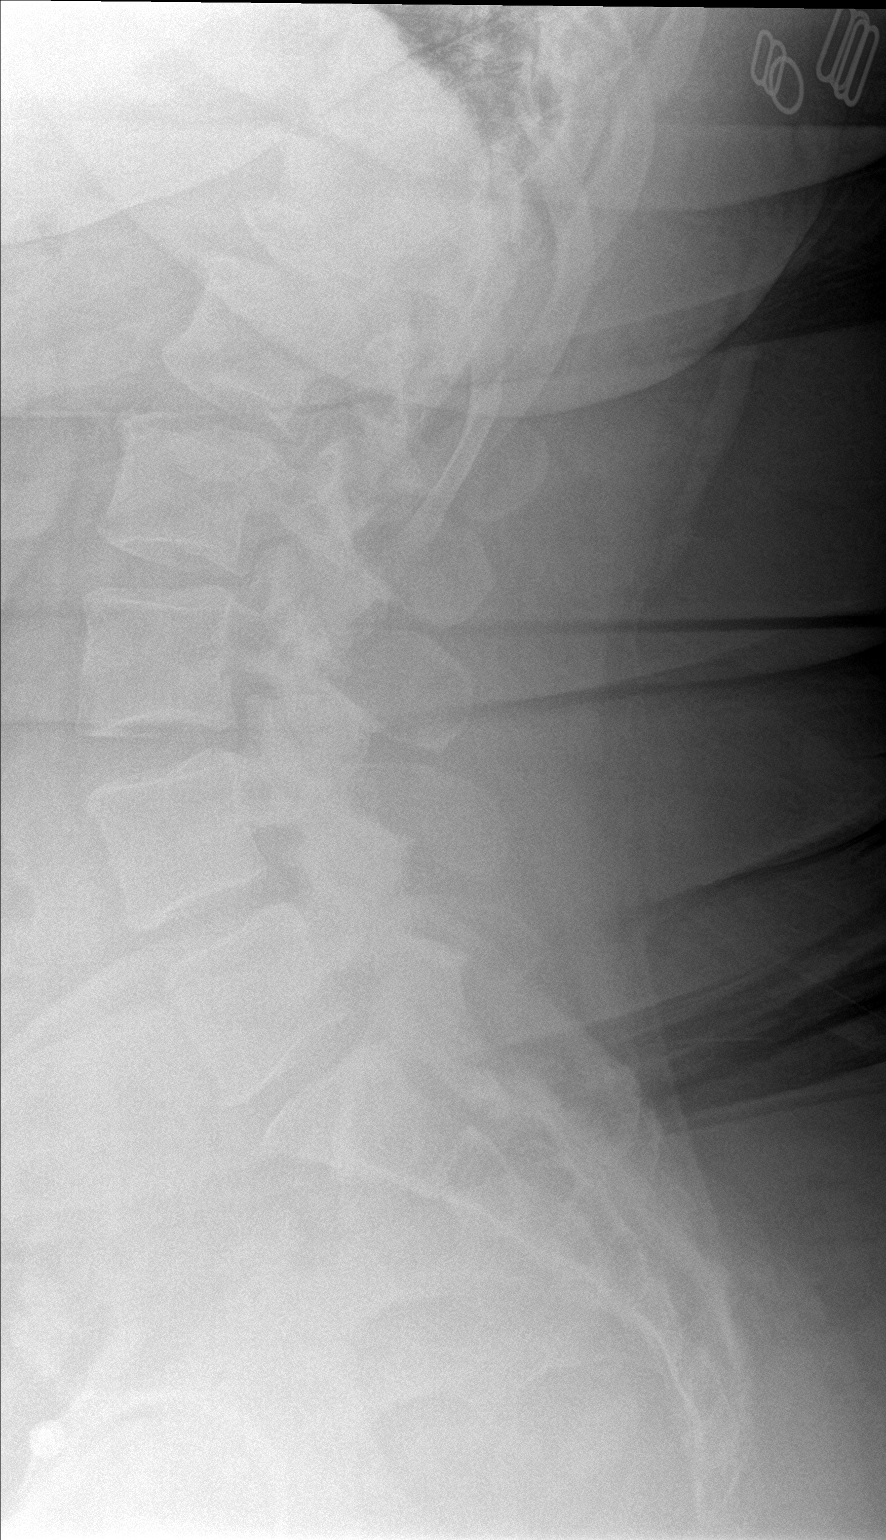

[4 of 4 positions shown; findings below may reference images not displayed]

FINDINGS: Study limited by body habitus.

Five lumbar type vertebral bodies.

Pars defects suggested at L5 on the RIGHT.

Alignment is normal. There is mild disc space narrowing throughout
the lumbar spine. Signs of facet arthropathy at L3-4, L4-5 and
L5-S1.
IMPRESSION: Negative for acute fracture or static subluxation, moderately
limited due to patient body habitus.

Suggestion of pars defect at L5 on the RIGHT.

## 2023-05-02 ENCOUNTER — Ambulatory Visit (HOSPITAL_COMMUNITY)
Admission: EM | Admit: 2023-05-02 | Discharge: 2023-05-02 | Disposition: A | Discharge status: Home / Self Care | Payer: Self-pay | Attending: Family Medicine | Admitting: Family Medicine

## 2023-05-02 ENCOUNTER — Encounter (HOSPITAL_COMMUNITY): Payer: Self-pay

## 2023-05-02 DIAGNOSIS — L03012 Cellulitis of left finger: Secondary | ICD-10-CM

## 2023-05-02 MED ORDER — IBUPROFEN 800 MG PO TABS: 800.0000 mg | ORAL_TABLET | Freq: Three times a day (TID) | ORAL | 0 refills | Status: DC | PRN

## 2023-05-02 MED ORDER — IBUPROFEN 800 MG PO TABS
ORAL_TABLET | ORAL | Status: AC
  Filled 2023-05-02: qty 1

## 2023-05-02 MED ORDER — IBUPROFEN 800 MG PO TABS
800.0000 mg | ORAL_TABLET | Freq: Once | ORAL | Status: AC
  Administered 2023-05-02: 800 mg via ORAL

## 2023-05-02 MED ORDER — AMOXICILLIN-POT CLAVULANATE 875-125 MG PO TABS: 1.0000 | ORAL_TABLET | Freq: Two times a day (BID) | ORAL | 0 refills | Status: AC

## 2023-05-02 MED ORDER — BACITRACIN ZINC 500 UNIT/GM EX OINT
TOPICAL_OINTMENT | CUTANEOUS | Status: AC
  Filled 2023-05-02: qty 0.9

## 2023-05-02 NOTE — ED Triage Notes (Signed)
Pt is here for left thumb pain and swelling x 2 weeks.

## 2023-05-02 NOTE — ED Provider Notes (Signed)
[facility]    CSN: [id number] Arrival date & time: 05/02/23  1637      History   Chief Complaint No chief complaint on file.   HPI Kelly Holloway is a 47 y.o. female.   HPI Here for pain and swelling in her right thumb.  Is been bothering her for about 2 weeks.  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      Home Medications    Prior to Admission medications   Medication Sig Start Date End Date Taking? Authorizing Provider  amoxicillin-clavulanate (AUGMENTIN) 875-125 MG tablet Take 1 tablet by mouth 2 (two) times daily for 7 days. 05/02/23 05/09/23 Yes Zenia Resides, MD  ibuprofen (ADVIL) 800 MG tablet Take 1 tablet (800 mg total) by mouth every 8 (eight) hours as needed (pain). 05/02/23  Yes Drako Maese, Janace Aris, MD    Family History Family History  Problem Relation Age of Onset   Cancer Mother    Cancer Father     Social History Social History   Tobacco Use   Smoking status: Every Day    Types: Cigarettes   Smokeless tobacco: Never  Vaping Use   Vaping status: Never Used  Substance Use Topics   Alcohol use: Yes    Comment: occasionally   Drug use: No     Allergies   Patient has no known allergies.   Review of Systems Review of Systems   Physical Exam Triage Vital Signs ED Triage Vitals  Encounter Vitals Group     BP 05/02/23 1759 135/79     Systolic BP Percentile --      Diastolic BP Percentile --      Pulse Rate 05/02/23 1759 61     Resp 05/02/23 1759 16     Temp 05/02/23 1759 97.8 F (36.6 C)     Temp Source 05/02/23  1759 Oral     SpO2 05/02/23 1759 99 %     Weight --      Height --      Head Circumference --      Peak Flow --      Pain Score 05/02/23 1803 7     Pain Loc --      Pain Education --      Exclude from Growth Chart --    No data found.  Updated Vital Signs BP 135/79 (BP Location: Left Arm)   Pulse 61   Temp 97.8 F (36.6 C) (Oral)   Resp 16   LMP 04/21/2023 (Approximate)   SpO2 99%   Visual Acuity Right Eye Distance:   Left Eye Distance:   Bilateral Distance:    Right Eye Near:   Left Eye Near:    Bilateral Near:     Physical Exam Vitals reviewed.  Constitutional:      General: She is not in acute distress.    Appearance: She is not toxic-appearing.  Skin:    Coloration: Skin is not jaundiced  or pale.     Comments: There is some swelling and erythema and tenderness of the tissues possible to the nail fold on her left thumb.  There is fluctuance proximal to the nail fold.  Neurological:     Mental Status: She is alert and oriented to person, place, and time.  Psychiatric:        Behavior: Behavior normal.      UC Treatments / Results  Labs (all labs ordered are listed, but only abnormal results are displayed) Labs Reviewed - No data to display  EKG   Radiology No results found.  Procedures Procedures (including critical care time)  Medications Ordered in UC Medications  ibuprofen (ADVIL) tablet 800 mg (has no administration in time range)    Initial Impression / Assessment and Plan / UC Course  I have reviewed the triage vital signs and the nursing notes.  Pertinent labs & imaging results that were available during my care of the patient were reviewed by me and considered in my medical decision making (see chart for details).    {The patient has been seen in Urgent Care in the last 3 years.  We discussed options including  and attempted digital block to drain the paronychia.  She really wanted to avoid extra injections, and I was concerned that  anesthesia would be ineffective with the tissues being so inflamed.  We decided to use an 18-gauge needle to make a small puncture wound in the paronychia without anesthesia.  He was done under clean conditions without complications.  Small amount of pus was obtained and the swelling was reduced some with expression of the pus.  Bacitracin and Band-Aids were applied. Wound care is explained  Augmentin is sent in and ibuprofen is sent to the pharmacy for pain.  1 dose of ibuprofen is given here.  Final Clinical Impressions(s) / UC Diagnoses   Final diagnoses:  Paronychia of left thumb     Discharge Instructions      Take ibuprofen 800 mg--1 tab every 8 hours as needed for pain.  We have given you 1 dose of this medication in the clinic  Take amoxicillin-clavulanate 875 mg--1 tab twice daily with food for 7 days  Do a warm soak on your thumb about 2 times a day.  You can wash it with soapy water and put a new Band-Aid on 2 times a day      ED Prescriptions     Medication Sig Dispense Auth. Provider   amoxicillin-clavulanate (AUGMENTIN) 875-125 MG tablet Take 1 tablet by mouth 2 (two) times daily for 7 days. 14 tablet Bobby Ragan, Janace Aris, MD   ibuprofen (ADVIL) 800 MG tablet Take 1 tablet (800 mg total) by mouth every 8 (eight) hours as needed (pain). 21 tablet Neita Landrigan, Janace Aris, MD      PDMP not reviewed this encounter.   Zenia Resides, MD 05/02/23 [phone]

## 2023-05-02 NOTE — Discharge Instructions (Signed)
Take ibuprofen 800 mg--1 tab every 8 hours as needed for pain.  We have given you 1 dose of this medication in the clinic  Take amoxicillin-clavulanate 875 mg--1 tab twice daily with food for 7 days  Do a warm soak on your thumb about 2 times a day.  You can wash it with soapy water and put a new Band-Aid on 2 times a day

## 2024-06-23 ENCOUNTER — Ambulatory Visit (INDEPENDENT_AMBULATORY_CARE_PROVIDER_SITE_OTHER): Payer: Self-pay

## 2024-06-23 ENCOUNTER — Encounter (HOSPITAL_COMMUNITY): Payer: Self-pay

## 2024-06-23 ENCOUNTER — Ambulatory Visit (HOSPITAL_COMMUNITY)
Admission: EM | Admit: 2024-06-23 | Discharge: 2024-06-23 | Disposition: A | Discharge status: Home / Self Care | Payer: Self-pay | Attending: Physician Assistant | Admitting: Physician Assistant

## 2024-06-23 DIAGNOSIS — H66001 Acute suppurative otitis media without spontaneous rupture of ear drum, right ear: Secondary | ICD-10-CM | POA: Insufficient documentation

## 2024-06-23 DIAGNOSIS — M545 Low back pain, unspecified: Secondary | ICD-10-CM

## 2024-06-23 DIAGNOSIS — H9211 Otorrhea, right ear: Secondary | ICD-10-CM | POA: Insufficient documentation

## 2024-06-23 DIAGNOSIS — L2089 Other atopic dermatitis: Secondary | ICD-10-CM | POA: Insufficient documentation

## 2024-06-23 LAB — BASIC METABOLIC PANEL WITH GFR
Anion gap: 12 (ref 5–15)
BUN: 8 mg/dL (ref 6–20)
CO2: 24 mmol/L (ref 22–32)
Calcium: 8.8 mg/dL — ABNORMAL LOW (ref 8.9–10.3)
Chloride: 103 mmol/L (ref 98–111)
Creatinine, Ser: 0.64 mg/dL (ref 0.44–1.00)
GFR, Estimated: >60 mL/min
Glucose, Bld: 79 mg/dL (ref 70–99)
Potassium: 3.9 mmol/L (ref 3.5–5.1)
Sodium: 138 mmol/L (ref 135–145)

## 2024-06-23 MED ORDER — IBUPROFEN 600 MG PO TABS: 600.0000 mg | ORAL_TABLET | Freq: Three times a day (TID) | ORAL | 0 refills | Status: AC | PRN

## 2024-06-23 MED ORDER — TRIAMCINOLONE ACETONIDE 0.1 % EX OINT: 1.0000 | TOPICAL_OINTMENT | Freq: Two times a day (BID) | CUTANEOUS | 0 refills | Status: AC

## 2024-06-23 MED ORDER — AMOXICILLIN-POT CLAVULANATE 875-125 MG PO TABS: 1.0000 | ORAL_TABLET | Freq: Two times a day (BID) | ORAL | 0 refills | Status: AC

## 2024-06-23 MED ORDER — METHOCARBAMOL 500 MG PO TABS: 500.0000 mg | ORAL_TABLET | Freq: Two times a day (BID) | ORAL | 0 refills | Status: AC

## 2024-06-23 NOTE — Discharge Instructions (Signed)
 We are treating you for an ear infection.  Start Augmentin  twice daily for 7 days.  Use over-the-counter medicine such as Tylenol  as well as the prescribed ibuprofen  to help with the pain.  I would like you to follow-up with a specialist (ENT).  Please call them to schedule an appointment.  If anything worsens and you have high fever, worsening pain, nausea/vomiting, weakness you need to be seen immediately.  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  Take ibuprofen  600 mg up to 3 times a day.  Do not take NSAIDs with this medication (aspirin, ibuprofen /Advil , naproxen /Aleve ).  Take Robaxin /methocarbamol  to twice a day.  This will make you sleepy so do not drive or drink alcohol while taking it.  Use heat and gentle stretch for symptom relief.  Avoid any strenuous activity for the next week including lifting more than 5 pounds.  If your symptoms do not go away with this medication please follow-up with sports medicine; call to schedule appointment.  If you have any weird symptoms like going to the bathroom on yourself without noticing it, worsening pain, fever, weakness on the inside of your legs you need to be seen immediately.  I have called in triamcinolone  to help with your eczema.  I also recommend using hypoallergenic soaps and detergents and an emollient which is available over-the-counter such as Aquaphor.  If this is not improving or if you have any signs of infection including redness, swelling, drainage you should return for reevaluation.

## 2024-06-23 NOTE — ED Triage Notes (Signed)
 Right ear pain and drainage x1 week   Pt states that she was pulling a pt up today and hurt the right side of her back

## 2024-06-23 NOTE — ED Provider Notes (Signed)
 " [facility]    CSN: [id number] Arrival date & time: 06/23/24  1601      History   Chief Complaint Chief Complaint  Patient presents with   Ear Pain    HPI Kelly Holloway is a 49 y.o. female.   Patient presents today with several concerns.  Her primary concern today is a weeklong history of right otalgia with associated otorrhea.  She describes the drainage as copious and malodorous.  She denies any recent illness or additional symptoms including cough or congestion.  She has not tried any over-the-counter otic drops.  She has been taking Tylenol  for pain relief.  Denies any recent swimming or airplane travel.  She does have a history of recurrent ear infections with associated hearing loss and was followed by ENT but has not seen them recently.  Denies any recent antibiotics.  She is confident that she is not pregnant.  In addition, she reports a 1 day history of right-sided back pain.  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  She denies previous injury or surgery involving her back.  She has taken ibuprofen  with her last dose earlier today.  She denies any bowel/bladder incontinence, lower extremity weakness, saddle anesthesia.  Denies history of malignancy.  She is having difficulty with her daily duties as a result of the symptoms.  In addition, she reports a prolonged history of pruritic rash on her lower leg.  She does have a history of eczema but has been applying emollients without improvement of symptoms.  She denies any changes to personal hygiene products including soaps or detergents.      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      Home Medications    Prior to Admission medications  Medication Sig Start Date End Date Taking? Authorizing Provider  amoxicillin -clavulanate (AUGMENTIN ) 875-125 MG tablet Take 1 tablet by mouth every 12 (twelve) hours. 06/23/24  Yes Kely Dohn K, PA-C  ibuprofen  (ADVIL ) 600 MG tablet Take 1 tablet (600 mg total) by mouth every 8 (eight) hours as needed. 06/23/24  Yes Azariah Latendresse K, PA-C  methocarbamol  (ROBAXIN ) 500 MG tablet Take 1 tablet (500 mg total) by mouth 2 (two) times daily. 06/23/24  Yes Kachina Niederer K, PA-C  triamcinolone  ointment (KENALOG ) 0.1 % Apply 1 Application topically 2 (two) times daily. 06/23/24  Yes Lynnleigh Soden, Rocky POUR, PA-C    Family History Family History  Problem Relation Age of Onset   Cancer Mother    Cancer Father     Social History Social History[1]   Allergies   Patient has no known allergies.   Review of Systems Review of Systems  Constitutional:  Positive for activity change. Negative for appetite change, fatigue and fever.  HENT:  Positive for ear discharge and ear pain. Negative for congestion, sinus pressure, sneezing and sore throat.   Respiratory:  Negative for cough and shortness of breath.   Cardiovascular:  Negative for chest pain.  Gastrointestinal:  Negative for diarrhea, nausea and vomiting.  Musculoskeletal:  Positive for back pain. Negative for arthralgias and myalgias.  Skin:  Positive for rash.  Neurological:  Negative for weakness and numbness.     Physical Exam Triage Vital Signs ED Triage Vitals  Encounter Vitals Group     BP 06/23/24 1722 138/82     Girls Systolic BP Percentile --      Girls Diastolic BP Percentile --      Boys Systolic BP Percentile --      Boys Diastolic BP Percentile --      Pulse Rate 06/23/24 1722 85     Resp 06/23/24 1722 18     Temp 06/23/24 1722 97.9 F (36.6 C)     Temp Source 06/23/24 1722 Oral      SpO2 06/23/24 1722 98 %     Weight --      Height --      Head Circumference --      Peak Flow --      Pain Score 06/23/24 1724 9     Pain Loc --      Pain Education --      Exclude from Growth Chart --    No data found.  Updated Vital Signs BP 138/82 (BP Location: Right Arm)   Pulse 85   Temp 97.9 F (36.6 C) (Oral)   Resp 18   LMP 06/22/2024   SpO2 98%   Visual Acuity Right Eye Distance:   Left Eye Distance:   Bilateral Distance:    Right Eye Near:   Left Eye Near:    Bilateral Near:     Physical Exam HENT:     Head: Normocephalic and atraumatic.     Right Ear: Tympanic membrane is bulging.     Left Ear: Tympanic membrane, ear canal and external ear normal. Tympanic membrane is not erythematous or bulging.     Ears:     Comments: Right ear: Bulging TM with purulent appearing otorrhea noted external auditory canal obscuring visualization of TM; able to visualize approximately 60% without obvious perforation.    Nose: Nose normal.     Mouth/Throat:     Pharynx: Uvula midline. No oropharyngeal exudate or posterior oropharyngeal erythema.  Cardiovascular:     Rate and Rhythm: Normal rate and regular rhythm.     Heart sounds: Normal heart sounds, S1 normal and S2 normal. No murmur heard. Pulmonary:     Effort: Pulmonary effort is normal.     Breath sounds: Normal breath sounds. No wheezing, rhonchi or rales.     Comments: Clear to auscultation bilaterally Musculoskeletal:     Cervical back: No tenderness or bony tenderness.     Thoracic back: Tenderness present. No bony tenderness.     Lumbar back: Tenderness and bony tenderness present. Negative right straight leg raise test and negative left straight leg raise test.     Comments: Back: Pain percussion of vertebrae.  Tender to palpation of right thoracic and lumbar paraspinal muscles.  Strength 5/5 bilateral lower extremities.  Negative straight leg raise bilaterally.  No deformity or step-off noted.  Skin:     Findings: Rash present. Rash is macular and papular.     Comments: Macrulopapular rash with evidence of excoriation and lichenification noted right posterior lower leg.  No erythema or wound noted.      UC Treatments / Results  Labs (all labs ordered are listed, but only abnormal results are displayed) Labs Reviewed  BASIC METABOLIC PANEL  WITH GFR    EKG   Radiology DG Lumbar Spine Complete Result Date: 06/23/2024 CLINICAL DATA:  Low back pain EXAM: LUMBAR SPINE - COMPLETE 4+ VIEW COMPARISON:  08/29/2021 FINDINGS: Five lumbar type vertebra. Lateral image is degraded by motion and habitus. Vertebral body heights are grossly maintained. Lower lumbar facet degenerative changes. Suspicion of mild disc space narrowing at L5-S1 and L4-L5 IMPRESSION: Limited by motion and habitus. Lower lumbar facet degenerative changes with suspected mild disc space narrowing at L4-L5 and L5-S1. Electronically Signed   By: Luke Bun M.D.   On: 06/23/2024 18:47    Procedures Procedures (including critical care time)  Medications Ordered in UC Medications - No data to display  Initial Impression / Assessment and Plan / UC Course  I have reviewed the triage vital signs and the nursing notes.  Pertinent labs & imaging results that were available during my care of the patient were reviewed by me and considered in my medical decision making (see chart for details).     Patient is well-appearing, afebrile, nontoxic, nontachycardic.  I am concerned for otitis media given her clinical presentation though I am unable to determine if there is a tympanic membrane rupture.  Will treat with Augmentin  twice daily for 7 days and have her follow-up with ENT to ensure that her symptoms resolve.  She was given the contact information with instruction to call to schedule an appointment.  She can use over-the-counter analgesics for pain relief.  We discussed that if anything worsens she needs to be seen  immediately.  She is significant bony tenderness and so x-ray was obtained that showed degenerative changes without acute osseous abnormality.  Suspect musculoskeletal etiology given her strenuous job and clinical presentation.  She was started on ibuprofen  600 mg up to 3 times a day we discussed that she is not to take additional NSAIDs with this medication but can use Tylenol  for breakthrough pain.  She was given a short course of Robaxin  and we discussed that this can be sedating and she is not to drive or drink alcohol taking it.  She has not had a recent metabolic panel and so this was obtained to ensure that we do not need to renally adjust her medication doses; we will contact her if her kidney function is abnormal and changes our treatment plan.  She is to follow-up with sports medicine if her symptoms are not improving.  Recommended she avoid any strenuous activity and was given a work excuse note.  Strict return precautions given.  Symptoms are consistent with atopic dermatitis.  Recommended she continue with hypoallergenic soaps and detergents and apply an emollient twice daily.  She was given triamcinolone  to apply as needed.  We discussed that if her symptoms worsen or change she is to return for reevaluation.  Return precautions given.  Final Clinical Impressions(s) / UC Diagnoses   Final diagnoses:  Acute right-sided low back pain without sciatica  Non-recurrent acute suppurative otitis media of right ear without spontaneous rupture of tympanic membrane  Otorrhea of right ear  Other atopic dermatitis     Discharge Instructions      We are treating you for an ear infection.  Start Augmentin  twice daily for 7 days.  Use over-the-counter medicine such as Tylenol  as well as the prescribed ibuprofen  to help with the pain.  I would like you to follow-up with a specialist (ENT).  Please call them to schedule an appointment.  If anything worsens and you have high  fever, worsening pain,  nausea/vomiting, weakness you need to be seen immediately.  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  Take ibuprofen  600 mg up to 3 times a day.  Do not take NSAIDs with this medication (aspirin, ibuprofen /Advil , naproxen /Aleve ).  Take Robaxin /methocarbamol  to twice a day.  This will make you sleepy so do not drive or drink alcohol while taking it.  Use heat and gentle stretch for symptom relief.  Avoid any strenuous activity for the next week including lifting more than 5 pounds.  If your symptoms do not go away with this medication please follow-up with sports medicine; call to schedule appointment.  If you have any weird symptoms like going to the bathroom on yourself without noticing it, worsening pain, fever, weakness on the inside of your legs you need to be seen immediately.  I have called in triamcinolone  to help with your eczema.  I also recommend using hypoallergenic soaps and detergents and an emollient which is available over-the-counter such as Aquaphor.  If this is not improving or if you have any signs of infection including redness, swelling, drainage you should return for reevaluation.     ED Prescriptions     Medication Sig Dispense Auth. Provider   triamcinolone  ointment (KENALOG ) 0.1 % Apply 1 Application topically 2 (two) times daily. 80 g Kielee Care K, PA-C   amoxicillin -clavulanate (AUGMENTIN ) 875-125 MG tablet Take 1 tablet by mouth every 12 (twelve) hours. 14 tablet Kemar Pandit K, PA-C   ibuprofen  (ADVIL ) 600 MG tablet Take 1 tablet (600 mg total) by mouth every 8 (eight) hours as needed. 30 tablet Shateka Petrea K, PA-C   methocarbamol  (ROBAXIN ) 500 MG tablet Take 1 tablet (500 mg total) by mouth 2 (two) times daily. 14 tablet Betti Goodenow K, PA-C      PDMP not reviewed this encounter.     [1]  Social History Tobacco Use   Smoking status: Every Day    Types: Cigarettes   Smokeless tobacco: Never  Vaping Use   Vaping  status: Never Used  Substance Use Topics   Alcohol use: Yes    Comment: occasionally   Drug use: No     Sherrell Rocky POUR, PA-C 06/23/24 1917  "

## 2024-06-26 ENCOUNTER — Ambulatory Visit (HOSPITAL_COMMUNITY): Payer: Self-pay
